# Patient Record
Sex: Female | Born: 1985 | Race: Black or African American | Hispanic: No | Marital: Single | Smoking: Never smoker
Health system: Southern US, Community
[De-identification: ages and names within clinical notes are randomized; demographics above are authoritative.]

## PROBLEM LIST (undated history)

## (undated) DIAGNOSIS — D259 Leiomyoma of uterus, unspecified: Secondary | ICD-10-CM

## (undated) DIAGNOSIS — I499 Cardiac arrhythmia, unspecified: Secondary | ICD-10-CM

## (undated) DIAGNOSIS — I10 Essential (primary) hypertension: Secondary | ICD-10-CM

## (undated) DIAGNOSIS — R002 Palpitations: Secondary | ICD-10-CM

## (undated) DIAGNOSIS — Z973 Presence of spectacles and contact lenses: Secondary | ICD-10-CM

## (undated) HISTORY — PX: WISDOM TOOTH EXTRACTION: SHX21

## (undated) HISTORY — PX: NO PAST SURGERIES: SHX2092

## (undated) HISTORY — DX: Essential (primary) hypertension: I10

## (undated) HISTORY — DX: Cardiac arrhythmia, unspecified: I49.9

---

## 2016-01-03 ENCOUNTER — Ambulatory Visit (INDEPENDENT_AMBULATORY_CARE_PROVIDER_SITE_OTHER): Payer: BLUE CROSS/BLUE SHIELD | Admitting: Physician Assistant

## 2016-01-03 VITALS — BP 154/96 | HR 105 | Temp 98.3°F | Resp 16 | Ht 61.5 in | Wt 182.0 lb

## 2016-01-03 DIAGNOSIS — IMO0001 Reserved for inherently not codable concepts without codable children: Secondary | ICD-10-CM

## 2016-01-03 DIAGNOSIS — R030 Elevated blood-pressure reading, without diagnosis of hypertension: Secondary | ICD-10-CM | POA: Diagnosis not present

## 2016-01-03 LAB — COMPLETE METABOLIC PANEL WITH GFR
ALBUMIN: 3.9 g/dL (ref 3.6–5.1)
ALK PHOS: 86 U/L (ref 33–115)
ALT: 15 U/L (ref 6–29)
AST: 13 U/L (ref 10–30)
BILIRUBIN TOTAL: 0.4 mg/dL (ref 0.2–1.2)
BUN: 12 mg/dL (ref 7–25)
CALCIUM: 9.7 mg/dL (ref 8.6–10.2)
CO2: 27 mmol/L (ref 20–31)
CREATININE: 0.63 mg/dL (ref 0.50–1.10)
Chloride: 102 mmol/L (ref 98–110)
GFR, Est African American: >89 mL/min (ref >=60)
GFR, Est Non African American: >89 mL/min (ref >=60)
Glucose, Bld: 78 mg/dL (ref 65–99)
POTASSIUM: 4.1 mmol/L (ref 3.5–5.3)
Sodium: 138 mmol/L (ref 135–146)
TOTAL PROTEIN: 7.5 g/dL (ref 6.1–8.1)

## 2016-01-03 LAB — POCT CBC
GRANULOCYTE PERCENT: 76.1 % (ref 37–80)
HCT, POC: 36.9 % — AB (ref 37.7–47.9)
Hemoglobin: 12.3 g/dL (ref 12.2–16.2)
LYMPH, POC: 1.9 (ref 0.6–3.4)
MCH, POC: 26.6 pg — AB (ref 27–31.2)
MCHC: 33.5 g/dL (ref 31.8–35.4)
MCV: 79.4 fL — AB (ref 80–97)
MID (CBC): 0.4 (ref 0–0.9)
MPV: 7 fL (ref 0–99.8)
PLATELET COUNT, POC: 285 10*3/uL (ref 142–424)
POC Granulocyte: 7.1 — AB (ref 2–6.9)
POC LYMPH %: 19.9 % (ref 10–50)
POC MID %: 4 %M (ref 0–12)
RBC: 4.64 M/uL (ref 4.04–5.48)
RDW, POC: 14 %
WBC: 9.3 10*3/uL (ref 4.6–10.2)

## 2016-01-03 LAB — TSH: TSH: 0.54 m[IU]/L

## 2016-01-03 MED ORDER — BLOOD PRESSURE KIT: 1.0000 | PACK | Status: DC

## 2016-01-03 MED ORDER — AMLODIPINE BESYLATE 5 MG PO TABS: 5.0000 mg | ORAL_TABLET | Freq: Every day | ORAL | Status: DC

## 2016-01-03 NOTE — Progress Notes (Signed)
[facility] [address] [place] [address] [phone]  Date:  01/03/2016   Name:  Lacey Carrillo   DOB:  Mar 22, 1986   MRN:  [id number]  PCP:  No primary care provider on file.    History of Present Illness:  Lacey Carrillo is a 30 y.o. female patient who presents to [facility] for elevated bp.    2 weeks ago, she had an urgent care, with elevated bp.  She went this morning for gynecologist with high bp of 190/140??.  No chest pains, sob, dizziness. She has headaches from time to time, however generally associated with not eating.  She has not eaten since 5 hours ago.  She works at a day care.  She eats breakfast and late afternoon.  She eats a lot of take out with chicken, burgers.  No vegetables.  No frozen vegetables or canned foods.  She will eat [employer] cheeseburger and fries, which she will have 1-2 times per week.  She will often get [place].   She does not exercise.  No smoking.  No children.       2 years ago at an urgent care, stated that her bp was high, but it was never navigated.    HTN of mother and father, first cousins, and uncles of both paternal and maternal sides.  No hx of MI or strokes in family.    There are no active problems to display for this patient.   Past Medical History  Diagnosis Date  . Hypertension     No past surgical history on file.  Social History  Substance Use Topics  . Smoking status: Never Smoker   . Smokeless tobacco: None  . Alcohol Use: No    Family History  Problem Relation Age of Onset  . Diabetes Mother   . Diabetes Father     No Known Allergies  Medication list has been reviewed and updated.  No current outpatient prescriptions on file prior to visit.   No current facility-administered medications on file prior to visit.    ROS   Physical Examination: BP 154/96 mmHg  Pulse 105  Temp(Src) 98.3 F (36.8 C)  Resp 16  Ht 5' 1.5" (1.562 m)  Wt 182 lb (82.555 kg)  BMI 33.84 kg/m2  LMP  12/26/2015 Ideal Body Weight: Weight in (lb) to have BMI = 25: 134.2  Physical Exam  Constitutional: She is oriented to person, place, and time. She appears well-developed and well-nourished. No distress.  HENT:  Head: Normocephalic and atraumatic.  Right Ear: External ear normal.  Left Ear: External ear normal.  Eyes: Conjunctivae and EOM are normal. Pupils are equal, round, and reactive to light.  Cardiovascular: Normal rate and regular rhythm.  Exam reveals no gallop and no friction rub.   No murmur heard. Pulses:      Carotid pulses are 2+ on the right side, and 2+ on the left side.      Radial pulses are 2+ on the right side, and 2+ on the left side.       Dorsalis pedis pulses are 2+ on the right side, and 2+ on the left side.  Pulmonary/Chest: Effort normal. No respiratory distress.  Neurological: She is alert and oriented to person, place, and time.  Skin: She is not diaphoretic.  Psychiatric: She has a normal mood and affect. Her behavior is normal.    Results for orders placed or performed in visit on 01/03/16  POCT CBC  Result  Value Ref Range   WBC 9.3 4.6 - 10.2 K/uL   Lymph, poc 1.9 0.6 - 3.4   POC LYMPH PERCENT 19.9 10 - 50 %L   MID (cbc) 0.4 0 - 0.9   POC MID % 4.0 0 - 12 %M   POC Granulocyte 7.1 (A) 2 - 6.9   Granulocyte percent 76.1 37 - 80 %G   RBC 4.64 4.04 - 5.48 M/uL   Hemoglobin 12.3 12.2 - 16.2 g/dL   HCT, POC 36.9 (A) 37.7 - 47.9 %   MCV 79.4 (A) 80 - 97 fL   MCH, POC 26.6 (A) 27 - 31.2 pg   MCHC 33.5 31.8 - 35.4 g/dL   RDW, POC 14.0 %   Platelet Count, POC 285 142 - 424 K/uL   MPV 7.0 0 - 99.8 fL   EKG: non-specific changes   Assessment and Plan: [name] is a 30 y.o. female who is here today for elevated bp. -she is started on amlodipine at this time -given dash diet and advised to start and increase exercise 4 times per week -she will check pulse and bp three times per week -rtc in 2 weeks for recheck.  Recheck ekg.  If her pulse does  not change, may have to change to bb  Elevated BP - Plan: POCT CBC, COMPLETE METABOLIC PANEL WITH GFR, TSH, EKG 12-Lead, amLODipine (NORVASC) 5 MG tablet, Blood Pressure KIT, DISCONTINUED: Blood Pressure KIT  Ivar Drape, PA-C [facility] 01/03/2016 2:13 PM

## 2016-01-03 NOTE — Patient Instructions (Addendum)
IF you received an x-ray today, you will receive an invoice from [facility]. Please contact [facility] Radiology at [phone] with questions or concerns regarding your invoice.   IF you received labwork today, you will receive an invoice from [employer]. Please contact [facility] at [phone] with questions or concerns regarding your invoice.   Our billing staff will not be able to assist you with questions regarding bills from these companies.  You will be contacted with the lab results as soon as they are available. The fastest way to get your results is to activate your My Chart account. Instructions are located on the last page of this paperwork. If you have not heard from [place] regarding the results in 2 weeks, please contact this office.  I would like you to check your blood pressure and pulse, three times per week.  Please record them on paper, or your phone would be best.   I will have you return in 2 weeks for follow up.   Included below is the dash diet.  I would like you to follow these restrictions. Please hydrate well with 64 oz of water or more per day. Avoid soft drinks and sweet teas.   Hypertension Hypertension, commonly called high blood pressure, is when the force of blood pumping through your arteries is too strong. Your arteries are the blood vessels that carry blood from your heart throughout your body. A blood pressure reading consists of a higher number over a lower number, such as 110/72. The higher number (systolic) is the pressure inside your arteries when your heart pumps. The lower number (diastolic) is the pressure inside your arteries when your heart relaxes. Ideally you want your blood pressure below 120/80. Hypertension forces your heart to work harder to pump blood. Your arteries may become narrow or stiff. Having untreated or uncontrolled hypertension can cause heart attack, stroke, kidney disease, and other problems. RISK  FACTORS Some risk factors for high blood pressure are controllable. Others are not.  Risk factors you cannot control include:   Race. You may be at higher risk if you are African American.  Age. Risk increases with age.  Gender. Men are at higher risk than women before age 55 years. After age 60, women are at higher risk than men. Risk factors you can control include:  Not getting enough exercise or physical activity.  Being overweight.  Getting too much fat, sugar, calories, or salt in your diet.  Drinking too much alcohol. SIGNS AND SYMPTOMS Hypertension does not usually cause signs or symptoms. Extremely high blood pressure (hypertensive crisis) may cause headache, anxiety, shortness of breath, and nosebleed. DIAGNOSIS To check if you have hypertension, your health care provider will measure your blood pressure while you are seated, with your arm held at the level of your heart. It should be measured at least twice using the same arm. Certain conditions can cause a difference in blood pressure between your right and left arms. A blood pressure reading that is higher than normal on one occasion does not mean that you need treatment. If it is not clear whether you have high blood pressure, you may be asked to return on a different day to have your blood pressure checked again. Or, you may be asked to monitor your blood pressure at home for 1 or more weeks. TREATMENT Treating high blood pressure includes making lifestyle changes and possibly taking medicine. Living a healthy lifestyle can help lower high blood pressure. You may need to  change some of your habits. Lifestyle changes may include:  Following the DASH diet. This diet is high in fruits, vegetables, and whole grains. It is low in salt, red meat, and added sugars.  Keep your sodium intake below 2,300 mg per day.  Getting at least 30-45 minutes of aerobic exercise at least 4 times per week.  Losing weight if necessary.  Not  smoking.  Limiting alcoholic beverages.  Learning ways to reduce stress. Your health care provider may prescribe medicine if lifestyle changes are not enough to get your blood pressure under control, and if one of the following is true:  You are 35-72 years of age and your systolic blood pressure is above 140.  You are 51 years of age or older, and your systolic blood pressure is above 150.  Your diastolic blood pressure is above 90.  You have diabetes, and your systolic blood pressure is over [id number] or your diastolic blood pressure is over 90.  You have kidney disease and your blood pressure is above 140/90.  You have heart disease and your blood pressure is above 140/90. Your personal target blood pressure may vary depending on your medical conditions, your age, and other factors. HOME CARE INSTRUCTIONS  Have your blood pressure rechecked as directed by your health care provider.   Take medicines only as directed by your health care provider. Follow the directions carefully. Blood pressure medicines must be taken as prescribed. The medicine does not work as well when you skip doses. Skipping doses also puts you at risk for problems.  Do not smoke.   Monitor your blood pressure at home as directed by your health care provider. SEEK MEDICAL CARE IF:   You think you are having a reaction to medicines taken.  You have recurrent headaches or feel dizzy.  You have swelling in your ankles.  You have trouble with your vision. SEEK IMMEDIATE MEDICAL CARE IF:  You develop a severe headache or confusion.  You have unusual weakness, numbness, or feel faint.  You have severe chest or abdominal pain.  You vomit repeatedly.  You have trouble breathing. MAKE SURE YOU:   Understand these instructions.  Will watch your condition.  Will get help right away if you are not doing well or get worse.   This information is not intended to replace advice given to you by your health  care provider. Make sure you discuss any questions you have with your health care provider.   Document Released: 10/09/2005 Document Revised: 02/23/2015 Document Reviewed: 08/01/2013 Elsevier Interactive Patient Education 2016 [place] DASH stands for "Dietary Approaches to Stop Hypertension." The DASH eating plan is a healthy eating plan that has been shown to reduce high blood pressure (hypertension). Additional health benefits may include reducing the risk of type 2 diabetes mellitus, heart disease, and stroke. The DASH eating plan may also help with weight loss. WHAT DO I NEED TO KNOW ABOUT THE DASH EATING PLAN? For the DASH eating plan, you will follow these general guidelines:  Choose foods with a percent daily value for sodium of less than 5% (as listed on the food label).  Use salt-free seasonings or herbs instead of table salt or sea salt.  Check with your health care provider or pharmacist before using salt substitutes.  Eat lower-sodium products, often labeled as "lower sodium" or "no salt added."  Eat fresh foods.  Eat more vegetables, fruits, and low-fat dairy products.  Choose whole grains. Look for the  word "whole" as the first word in the ingredient list.  Choose fish and skinless chicken or [place] more often than red meat. Limit fish, poultry, and meat to 6 oz (170 g) each day.  Limit sweets, desserts, sugars, and sugary drinks.  Choose heart-healthy fats.  Limit cheese to 1 oz (28 g) per day.  Eat more home-cooked food and less restaurant, buffet, and fast food.  Limit fried foods.  Cook foods using methods other than frying.  Limit canned vegetables. If you do use them, rinse them well to decrease the sodium.  When eating at a restaurant, ask that your food be prepared with less salt, or no salt if possible. WHAT FOODS CAN I EAT? Seek help from a dietitian for individual calorie needs. Grains Whole grain or whole wheat bread.  Brown rice. Whole grain or whole wheat pasta. Quinoa, bulgur, and whole grain cereals. Low-sodium cereals. Corn or whole wheat flour tortillas. Whole grain cornbread. Whole grain crackers. Low-sodium crackers. Vegetables Fresh or frozen vegetables (raw, steamed, roasted, or grilled). Low-sodium or reduced-sodium tomato and vegetable juices. Low-sodium or reduced-sodium tomato sauce and paste. Low-sodium or reduced-sodium canned vegetables.  Fruits All fresh, canned (in natural juice), or frozen fruits. Meat and Other Protein Products Ground beef (85% or leaner), grass-fed beef, or beef trimmed of fat. Skinless chicken or [place]. Ground chicken or [place]. Pork trimmed of fat. All fish and seafood. Eggs. Dried beans, peas, or lentils. Unsalted nuts and seeds. Unsalted canned beans. Dairy Low-fat dairy products, such as skim or 1% milk, 2% or reduced-fat cheeses, low-fat ricotta or cottage cheese, or plain low-fat yogurt. Low-sodium or reduced-sodium cheeses. Fats and Oils Tub margarines without trans fats. Light or reduced-fat mayonnaise and salad dressings (reduced sodium). Avocado. Safflower, olive, or canola oils. Natural peanut or almond butter. Other Unsalted popcorn and pretzels. The items listed above may not be a complete list of recommended foods or beverages. Contact your dietitian for more options. WHAT FOODS ARE NOT RECOMMENDED? Grains White bread. White pasta. White rice. Refined cornbread. Bagels and croissants. Crackers that contain trans fat. Vegetables Creamed or fried vegetables. Vegetables in a cheese sauce. Regular canned vegetables. Regular canned tomato sauce and paste. Regular tomato and vegetable juices. Fruits Dried fruits. Canned fruit in light or heavy syrup. Fruit juice. Meat and Other Protein Products Fatty cuts of meat. Ribs, chicken wings, bacon, sausage, bologna, salami, chitterlings, fatback, hot dogs, bratwurst, and packaged luncheon meats. Salted nuts and seeds.  Canned beans with salt. Dairy Whole or 2% milk, cream, half-and-half, and cream cheese. Whole-fat or sweetened yogurt. Full-fat cheeses or blue cheese. Nondairy creamers and whipped toppings. Processed cheese, cheese spreads, or cheese curds. Condiments Onion and garlic salt, seasoned salt, table salt, and sea salt. Canned and packaged gravies. Worcestershire sauce. Tartar sauce. Barbecue sauce. Teriyaki sauce. Soy sauce, including reduced sodium. Steak sauce. Fish sauce. Oyster sauce. Cocktail sauce. Horseradish. Ketchup and mustard. Meat flavorings and tenderizers. Bouillon cubes. Hot sauce. Tabasco sauce. Marinades. Taco seasonings. Relishes. Fats and Oils Butter, stick margarine, lard, shortening, ghee, and bacon fat. Coconut, palm kernel, or palm oils. Regular salad dressings. Other Pickles and olives. Salted popcorn and pretzels. The items listed above may not be a complete list of foods and beverages to avoid. Contact your dietitian for more information. WHERE CAN I FIND MORE INFORMATION? National Heart, Lung, and Blood Institute: travelstabloid.com   This information is not intended to replace advice given to you by your health care provider. Make sure you discuss  any questions you have with your health care provider.   Document Released: 09/28/2011 Document Revised: 10/30/2014 Document Reviewed: 08/13/2013 Elsevier Interactive Patient Education [employer].

## 2016-01-18 ENCOUNTER — Ambulatory Visit (INDEPENDENT_AMBULATORY_CARE_PROVIDER_SITE_OTHER): Payer: BLUE CROSS/BLUE SHIELD | Admitting: Physician Assistant

## 2016-01-18 VITALS — BP 142/90 | HR 103 | Temp 98.5°F | Resp 19 | Wt 180.0 lb

## 2016-01-18 DIAGNOSIS — I10 Essential (primary) hypertension: Secondary | ICD-10-CM | POA: Diagnosis not present

## 2016-01-18 MED ORDER — METOPROLOL SUCCINATE ER 25 MG PO TB24: 25.0000 mg | ORAL_TABLET | Freq: Every day | ORAL | Status: DC

## 2016-01-18 NOTE — Patient Instructions (Addendum)
IF you received an x-ray today, you will receive an invoice from [facility] Radiology. Please contact [facility]iology at [phone] with questions or concerns regarding your invoice.   IF you received labwork today, you will receive an invoice from [employer]. Please contact [facility] at [phone] with questions or concerns regarding your invoice.   Our billing staff will not be able to assist you with questions regarding bills from these companies.  You will be contacted with the lab results as soon as they are available. The fastest way to get your results is to activate your My Chart account. Instructions are located on the last page of this paperwork. If you have not heard from [place] regarding the results in 2 weeks, please contact this office.    I would like to follow up in 2-3 weeks for recheck. You can start exercising at this time.  I will include the dash diet for your review.  DASH Eating Plan DASH stands for "Dietary Approaches to Stop Hypertension." The DASH eating plan is a healthy eating plan that has been shown to reduce high blood pressure (hypertension). Additional health benefits may include reducing the risk of type 2 diabetes mellitus, heart disease, and stroke. The DASH eating plan may also help with weight loss. WHAT DO I NEED TO KNOW ABOUT THE DASH EATING PLAN? For the DASH eating plan, you will follow these general guidelines:  Choose foods with a percent daily value for sodium of less than 5% (as listed on the food label).  Use salt-free seasonings or herbs instead of table salt or sea salt.  Check with your health care provider or pharmacist before using salt substitutes.  Eat lower-sodium products, often labeled as "lower sodium" or "no salt added."  Eat fresh foods.  Eat more vegetables, fruits, and low-fat dairy products.  Choose whole grains. Look for the word "whole" as the first word in the ingredient  list.  Choose fish and skinless chicken or [place] more often than red meat. Limit fish, poultry, and meat to 6 oz (170 g) each day.  Limit sweets, desserts, sugars, and sugary drinks.  Choose heart-healthy fats.  Limit cheese to 1 oz (28 g) per day.  Eat more home-cooked food and less restaurant, buffet, and fast food.  Limit fried foods.  Cook foods using methods other than frying.  Limit canned vegetables. If you do use them, rinse them well to decrease the sodium.  When eating at a restaurant, ask that your food be prepared with less salt, or no salt if possible. WHAT FOODS CAN I EAT? Seek help from a dietitian for individual calorie needs. Grains Whole grain or whole wheat bread. Brown rice. Whole grain or whole wheat pasta. Quinoa, bulgur, and whole grain cereals. Low-sodium cereals. Corn or whole wheat flour tortillas. Whole grain cornbread. Whole grain crackers. Low-sodium crackers. Vegetables Fresh or frozen vegetables (raw, steamed, roasted, or grilled). Low-sodium or reduced-sodium tomato and vegetable juices. Low-sodium or reduced-sodium tomato sauce and paste. Low-sodium or reduced-sodium canned vegetables.  Fruits All fresh, canned (in natural juice), or frozen fruits. Meat and Other Protein Products Ground beef (85% or leaner), grass-fed beef, or beef trimmed of fat. Skinless chicken or [place]. Ground chicken or [place]. Pork trimmed of fat. All fish and seafood. Eggs. Dried beans, peas, or lentils. Unsalted nuts and seeds. Unsalted canned beans. Dairy Low-fat dairy products, such as skim or 1% milk, 2% or reduced-fat cheeses, low-fat ricotta or cottage cheese, or plain  low-fat yogurt. Low-sodium or reduced-sodium cheeses. Fats and Oils Tub margarines without trans fats. Light or reduced-fat mayonnaise and salad dressings (reduced sodium). Avocado. Safflower, olive, or canola oils. Natural peanut or almond butter. Other Unsalted popcorn and pretzels. The items listed  above may not be a complete list of recommended foods or beverages. Contact your dietitian for more options. WHAT FOODS ARE NOT RECOMMENDED? Grains White bread. White pasta. White rice. Refined cornbread. Bagels and croissants. Crackers that contain trans fat. Vegetables Creamed or fried vegetables. Vegetables in a cheese sauce. Regular canned vegetables. Regular canned tomato sauce and paste. Regular tomato and vegetable juices. Fruits Dried fruits. Canned fruit in light or heavy syrup. Fruit juice. Meat and Other Protein Products Fatty cuts of meat. Ribs, chicken wings, bacon, sausage, bologna, salami, chitterlings, fatback, hot dogs, bratwurst, and packaged luncheon meats. Salted nuts and seeds. Canned beans with salt. Dairy Whole or 2% milk, cream, half-and-half, and cream cheese. Whole-fat or sweetened yogurt. Full-fat cheeses or blue cheese. Nondairy creamers and whipped toppings. Processed cheese, cheese spreads, or cheese curds. Condiments Onion and garlic salt, seasoned salt, table salt, and sea salt. Canned and packaged gravies. Worcestershire sauce. Tartar sauce. Barbecue sauce. Teriyaki sauce. Soy sauce, including reduced sodium. Steak sauce. Fish sauce. Oyster sauce. Cocktail sauce. Horseradish. Ketchup and mustard. Meat flavorings and tenderizers. Bouillon cubes. Hot sauce. Tabasco sauce. Marinades. Taco seasonings. Relishes. Fats and Oils Butter, stick margarine, lard, shortening, ghee, and bacon fat. Coconut, palm kernel, or palm oils. Regular salad dressings. Other Pickles and olives. Salted popcorn and pretzels. The items listed above may not be a complete list of foods and beverages to avoid. Contact your dietitian for more information. WHERE CAN I FIND MORE INFORMATION? National Heart, Lung, and Blood Institute: travelstabloid.com   This information is not intended to replace advice given to you by your health care provider. Make sure you  discuss any questions you have with your health care provider.   Document Released: 09/28/2011 Document Revised: 10/30/2014 Document Reviewed: 08/13/2013 Elsevier Interactive Patient Education [employer].

## 2016-01-31 ENCOUNTER — Other Ambulatory Visit: Payer: Self-pay | Admitting: Physician Assistant

## 2016-01-31 NOTE — Telephone Encounter (Signed)
Pt is requesting a refill of amLODipine (NORVASC) 5 MG tablet. [facility] on [address] Please call when sent to pharmacy.

## 2016-02-20 NOTE — Progress Notes (Signed)
[facility] [address] [place] [address] [phone]  Date:  01/18/2016   Name:  Lacey Carrillo   DOB:  1986-06-04   MRN:  [id number]  PCP:  No primary care provider on file.   Chief Complaint  Patient presents with  . Follow-up    BP   History of Present Illness:  Lacey Carrillo is a 30 y.o. female patient who presents to [facility] for blood pressure recheck.  --Patient states that her blood pressure continues to be elevated.  She denies chest pains, palpitations, leg swelling, or dizziness.  She has attempted to avoid some her sodium intake, however not much.  She reports no adverse effect with the medication at this time.  She has not started to exercise, however has plans to begin.      There are no active problems to display for this patient.   Past Medical History  Diagnosis Date  . Hypertension     No past surgical history on file.  Social History  Substance Use Topics  . Smoking status: Never Smoker   . Smokeless tobacco: None  . Alcohol Use: No    Family History  Problem Relation Age of Onset  . Diabetes Mother   . Diabetes Father     No Known Allergies  Medication list has been reviewed and updated.  Current Outpatient Prescriptions on File Prior to Visit  Medication Sig Dispense Refill  . Blood Pressure KIT 1 each by Does not apply route 3 (three) times a week. 1 each 0   No current facility-administered medications on file prior to visit.    ROS ROS otherwise unremarkable unless listed above.   Physical Examination: BP 142/90 mmHg  Pulse 103  Temp(Src) 98.5 F (36.9 C) (Oral)  Resp 19  Wt 180 lb (81.647 kg)  SpO2 98%  LMP 12/26/2015 Ideal Body Weight:    Physical Exam  Constitutional: She is oriented to person, place, and time. She appears well-developed and well-nourished. No distress.  HENT:  Head: Normocephalic and atraumatic.  Right Ear: External ear normal.  Left Ear: External ear normal.  Eyes:  Conjunctivae and EOM are normal. Pupils are equal, round, and reactive to light.  Cardiovascular: Regular rhythm and normal heart sounds.  Exam reveals no gallop and no friction rub.   No murmur heard. Pulses:      Carotid pulses are 2+ on the right side.      Radial pulses are 2+ on the right side, and 2+ on the left side.       Dorsalis pedis pulses are 2+ on the right side, and 2+ on the left side.  Minimal tach  Pulmonary/Chest: Effort normal. No respiratory distress. She has no wheezes.  Neurological: She is alert and oriented to person, place, and time.  Skin: She is not diaphoretic.  Psychiatric: She has a normal mood and affect. Her behavior is normal.     Assessment and Plan: Lacey Carrillo is a 30 y.o. female who is here today for blood pressure recheck. --with bp continuing to be elevated, as well as heart rate (we will start her on a long acting beta blocker.  I have advised that she start exercise regimen, and adhere to DASH diet.  She will return in 2-3 weeks for recheck.  Essential hypertension - Plan: metoprolol succinate (TOPROL-XL) 25 MG 24 hr tablet  Ivar Drape, PA-C [facility]

## 2018-08-22 ENCOUNTER — Other Ambulatory Visit: Payer: Self-pay

## 2018-08-22 ENCOUNTER — Encounter (HOSPITAL_COMMUNITY): Payer: Self-pay | Admitting: Emergency Medicine

## 2018-08-22 ENCOUNTER — Emergency Department (HOSPITAL_COMMUNITY)
Admission: EM | Admit: 2018-08-22 | Discharge: 2018-08-23 | Disposition: A | Discharge status: Home / Self Care | Payer: BLUE CROSS/BLUE SHIELD | Attending: Emergency Medicine | Admitting: Emergency Medicine

## 2018-08-22 DIAGNOSIS — Z79899 Other long term (current) drug therapy: Secondary | ICD-10-CM | POA: Insufficient documentation

## 2018-08-22 DIAGNOSIS — I10 Essential (primary) hypertension: Secondary | ICD-10-CM | POA: Insufficient documentation

## 2018-08-22 DIAGNOSIS — Y929 Unspecified place or not applicable: Secondary | ICD-10-CM | POA: Insufficient documentation

## 2018-08-22 DIAGNOSIS — S93401A Sprain of unspecified ligament of right ankle, initial encounter: Secondary | ICD-10-CM | POA: Insufficient documentation

## 2018-08-22 DIAGNOSIS — X58XXXA Exposure to other specified factors, initial encounter: Secondary | ICD-10-CM | POA: Insufficient documentation

## 2018-08-22 DIAGNOSIS — Y999 Unspecified external cause status: Secondary | ICD-10-CM | POA: Insufficient documentation

## 2018-08-22 DIAGNOSIS — Y939 Activity, unspecified: Secondary | ICD-10-CM | POA: Diagnosis not present

## 2018-08-22 NOTE — ED Triage Notes (Signed)
Pt had a mechanical fall down two steps.  Her right ankle is swollen and painful. She is able to put weight on it but there is pain.

## 2018-08-23 ENCOUNTER — Emergency Department (HOSPITAL_COMMUNITY): Payer: BLUE CROSS/BLUE SHIELD

## 2018-08-23 MED ORDER — IBUPROFEN 800 MG PO TABS
800.0000 mg | ORAL_TABLET | Freq: Once | ORAL | Status: AC
  Administered 2018-08-23: 800 mg via ORAL
  Filled 2018-08-23: qty 1

## 2018-08-23 NOTE — Discharge Instructions (Signed)
We recommend consistent use of ibuprofen 600mg  every 6 hours. Wear a brace for stability. Ice We recommend follow-up with a primary care doctor to ensure resolution of symptoms.  Return to the ED for any new or concerning symptoms.

## 2018-08-23 NOTE — ED Provider Notes (Signed)
[facility] EMERGENCY DEPARTMENT Provider Note   CSN: [id number] Arrival date & time: 08/22/18  2322    History   Chief Complaint Chief Complaint  Patient presents with  . Ankle Pain    HPI Lacey Carrillo is a 32 y.o. female.  The history is provided by the patient. No language interpreter was used.  Ankle Pain   The incident occurred 12 to 24 hours ago. The incident occurred at home. Injury mechanism: twisted while walking down steps. The pain is present in the right ankle. The quality of the pain is described as aching. The pain is mild. The pain has been constant since onset. Pertinent negatives include no inability to bear weight, no loss of motion and no tingling. She reports no foreign bodies present. The symptoms are aggravated by bearing weight. She has tried ice for the symptoms. The treatment provided no relief.    Past Medical History:  Diagnosis Date  . Hypertension     There are no active problems to display for this patient.   History reviewed. No pertinent surgical history.   OB History   None      Home Medications    Prior to Admission medications   Medication Sig Start Date End Date Taking? Authorizing Provider  amLODipine (NORVASC) 5 MG tablet TAKE 1 TABLET BY MOUTH DAILY 02/02/16   Ivar Drape D, PA  Blood Pressure KIT 1 each by Does not apply route 3 (three) times a week. 01/03/16   Ivar Drape D, PA  metoprolol succinate (TOPROL-XL) 25 MG 24 hr tablet Take 1 tablet (25 mg total) by mouth daily. 01/18/16   Joretta Bachelor, PA    Family History Family History  Problem Relation Age of Onset  . Diabetes Mother   . Diabetes Father     Social History Social History   Tobacco Use  . Smoking status: Never Smoker  Substance Use Topics  . Alcohol use: No    Alcohol/week: 0.0 standard drinks  . Drug use: No     Allergies   Patient has no known allergies.   Review of Systems Review of Systems    Musculoskeletal: Positive for arthralgias.  Neurological: Negative for tingling.  Ten systems reviewed and are negative for acute change, except as noted in the HPI.    Physical Exam Updated Vital Signs BP (!) 128/97 (BP Location: Right Arm)   Pulse (!) 104   Temp 99.2 F (37.3 C) (Oral)   Resp 14   Ht 5' (1.524 m)   Wt 85.7 kg   SpO2 100%   BMI 36.91 kg/m   Physical Exam  Constitutional: She is oriented to person, place, and time. She appears well-developed and well-nourished. No distress.  Nontoxic appearing and in NAD  HENT:  Head: Normocephalic and atraumatic.  Eyes: Conjunctivae and EOM are normal. No scleral icterus.  Neck: Normal range of motion.  Cardiovascular: Normal rate, regular rhythm and intact distal pulses.  DP pulse 2+ in the RLE  Pulmonary/Chest: Effort normal. No stridor. No respiratory distress.  Respirations even and unlabored  Musculoskeletal: Normal range of motion.  Mild swelling to the lateral malleolus. No deformity. Normal ROM of the R ankle.  Neurological: She is alert and oriented to person, place, and time. She exhibits normal muscle tone. Coordination normal.  Sensation to light touch intact. Able to wiggle all toes.  Skin: Skin is warm and dry. No rash noted. She is not diaphoretic. No erythema. No pallor.  Psychiatric:  She has a normal mood and affect. Her behavior is normal.  Nursing note and vitals reviewed.    ED Treatments / Results  Labs (all labs ordered are listed, but only abnormal results are displayed) Labs Reviewed - No data to display  EKG None  Radiology Dg Ankle Complete Right  Result Date: 08/23/2018 CLINICAL DATA:  Fall tonight with right ankle pain. EXAM: RIGHT ANKLE - COMPLETE 3+ VIEW COMPARISON:  None. FINDINGS: There is no evidence of fracture, dislocation, or joint effusion. There is no evidence of arthropathy or other focal bone abnormality. Soft tissues are unremarkable. IMPRESSION: Negative. Electronically  Signed   By: Marin Olp M.D.   On: 08/23/2018 01:48    Procedures Procedures (including critical care time)  Medications Ordered in ED Medications  ibuprofen (ADVIL,MOTRIN) tablet 800 mg (800 mg Oral Given 08/23/18 0145)     Initial Impression / Assessment and Plan / ED Course  I have reviewed the triage vital signs and the nursing notes.  Pertinent labs & imaging results that were available during my care of the patient were reviewed by me and considered in my medical decision making (see chart for details).     Patient presents to the emergency department for evaluation of R ankle pain. Patient neurovascularly intact on exam. Imaging negative for fracture, dislocation, bony deformity. No swelling, erythema, heat to touch to the affected area; no concern for septic joint. Compartments in the affected extremity are soft. Plan for supportive management including RICE and NSAIDs; primary care follow up as needed. Return precautions discussed and provided. Patient discharged in stable condition with no unaddressed concerns.   Final Clinical Impressions(s) / ED Diagnoses   Final diagnoses:  Sprain of right ankle, unspecified ligament, initial encounter    ED Discharge Orders    None       Antonietta Breach, PA-C 08/23/18 [id number]    Orpah Greek, MD 08/23/18 [phone]

## 2018-08-23 NOTE — ED Notes (Signed)
Pt verbalizes understanding of d/c instructions. Pt taken to lobby in wheelchair at d/c with all belongings.   

## 2018-10-23 DIAGNOSIS — Z8616 Personal history of COVID-19: Secondary | ICD-10-CM

## 2018-10-23 HISTORY — DX: Personal history of COVID-19: Z86.16

## 2019-08-12 DIAGNOSIS — I10 Essential (primary) hypertension: Secondary | ICD-10-CM | POA: Insufficient documentation

## 2019-12-26 IMAGING — DX DG ANKLE COMPLETE 3+V*R*
3 series · 3 of 3 positions shown · non-contrast
Comparison: None.

CLINICAL DATA: Fall tonight with right ankle pain.

EXAM:
RIGHT ANKLE - COMPLETE 3+ VIEW

[ankle ap]
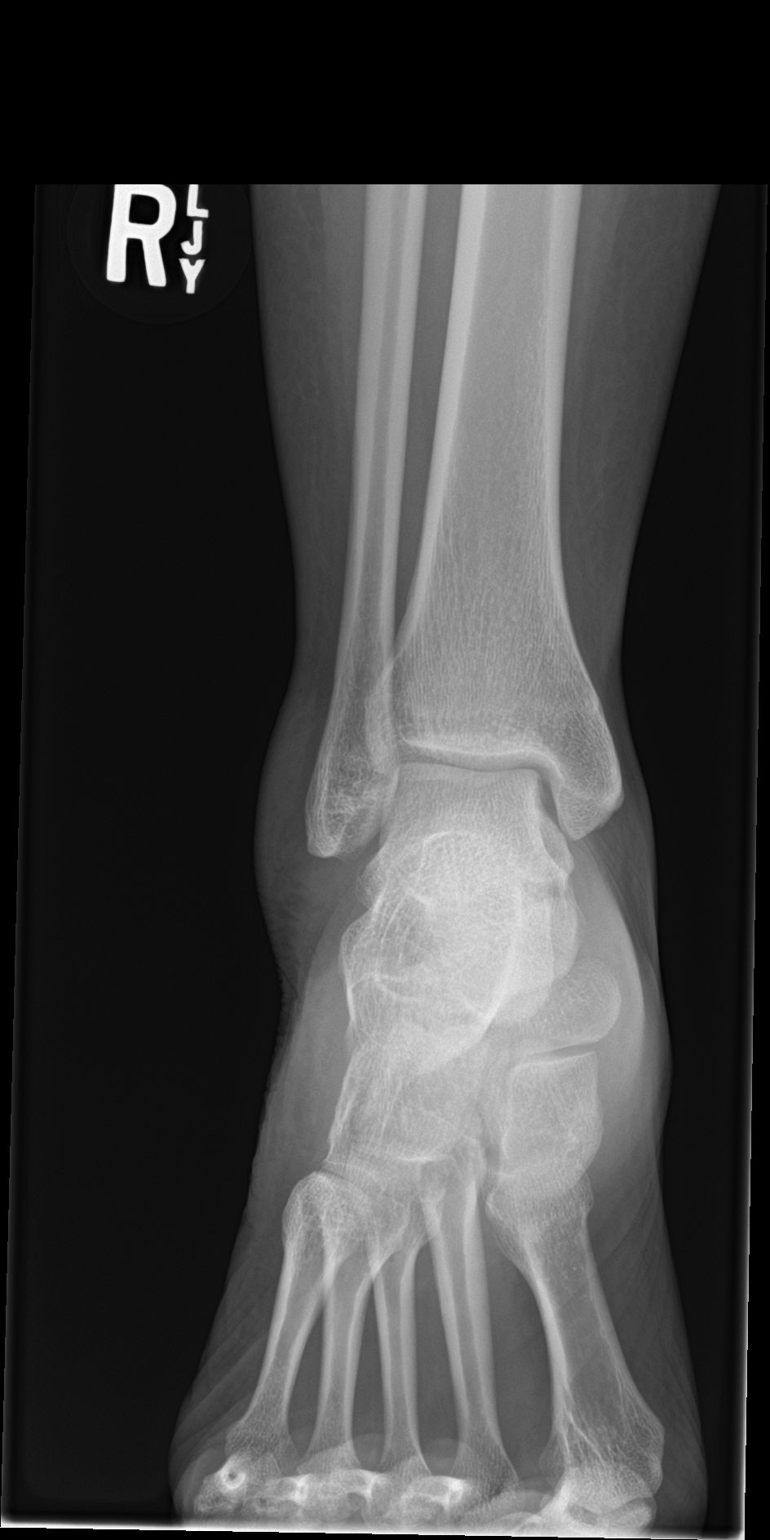

[ankle obl]
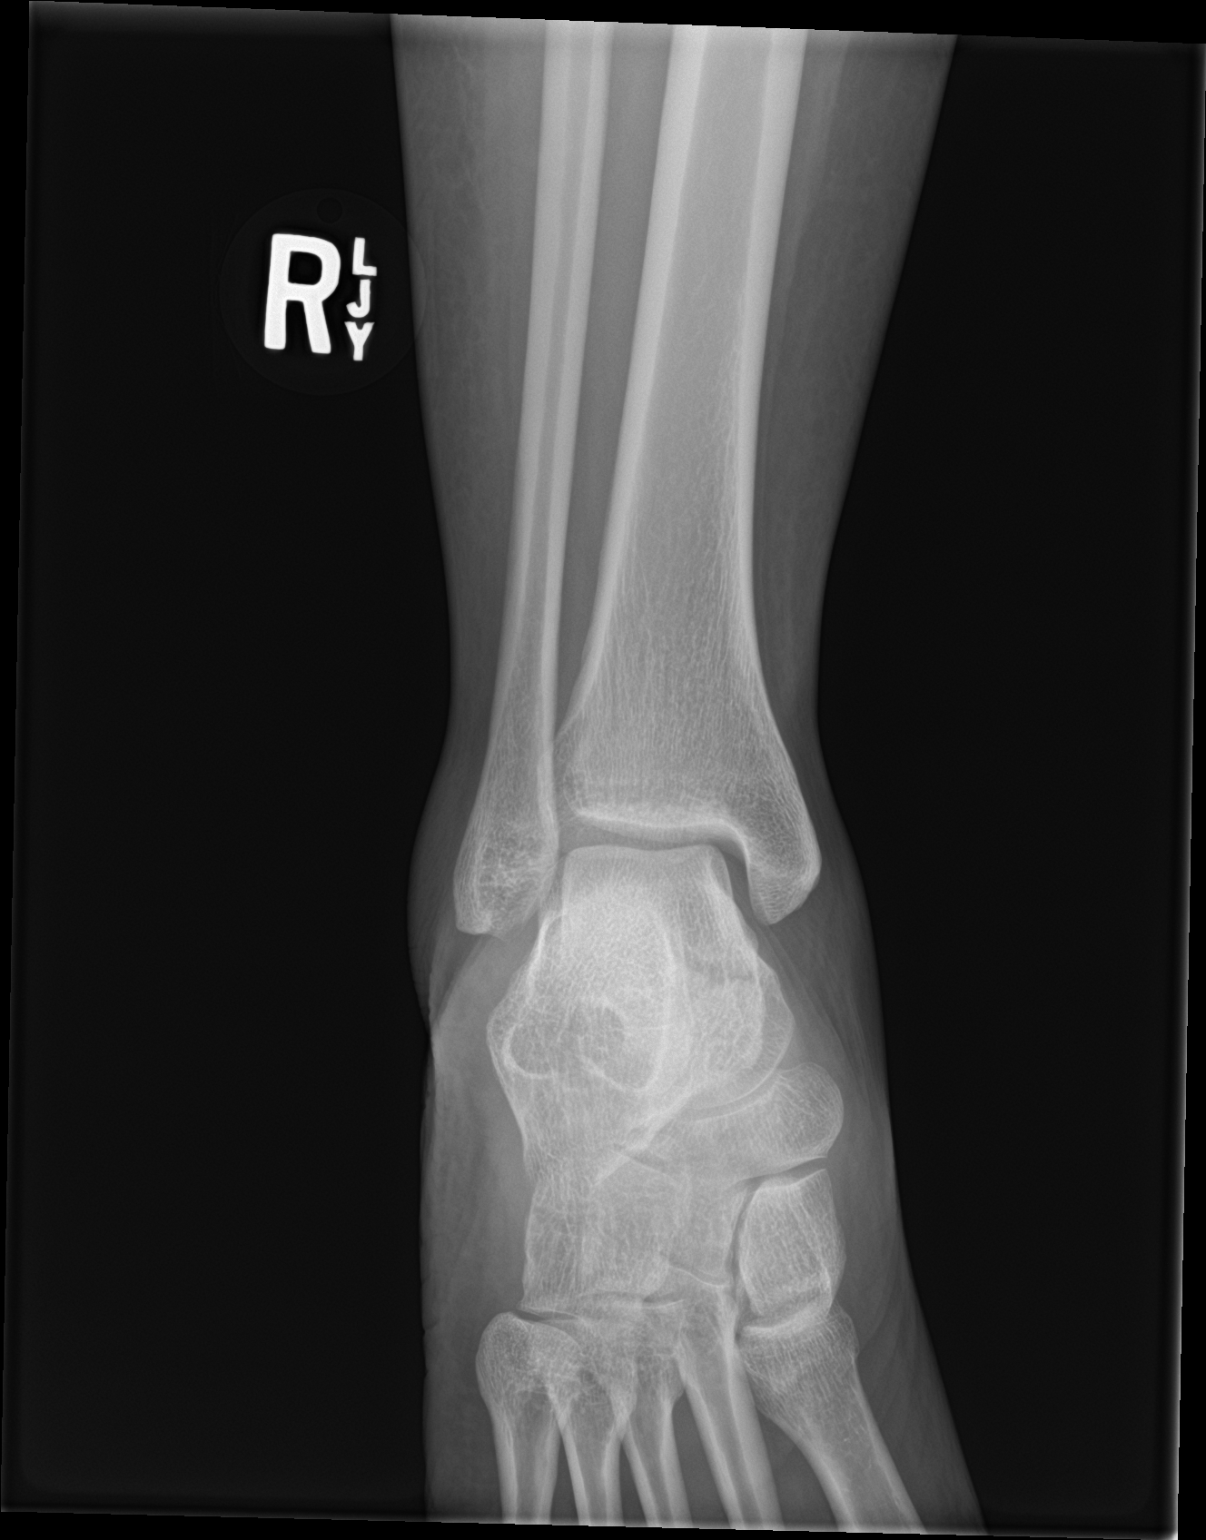

[ankle lat]
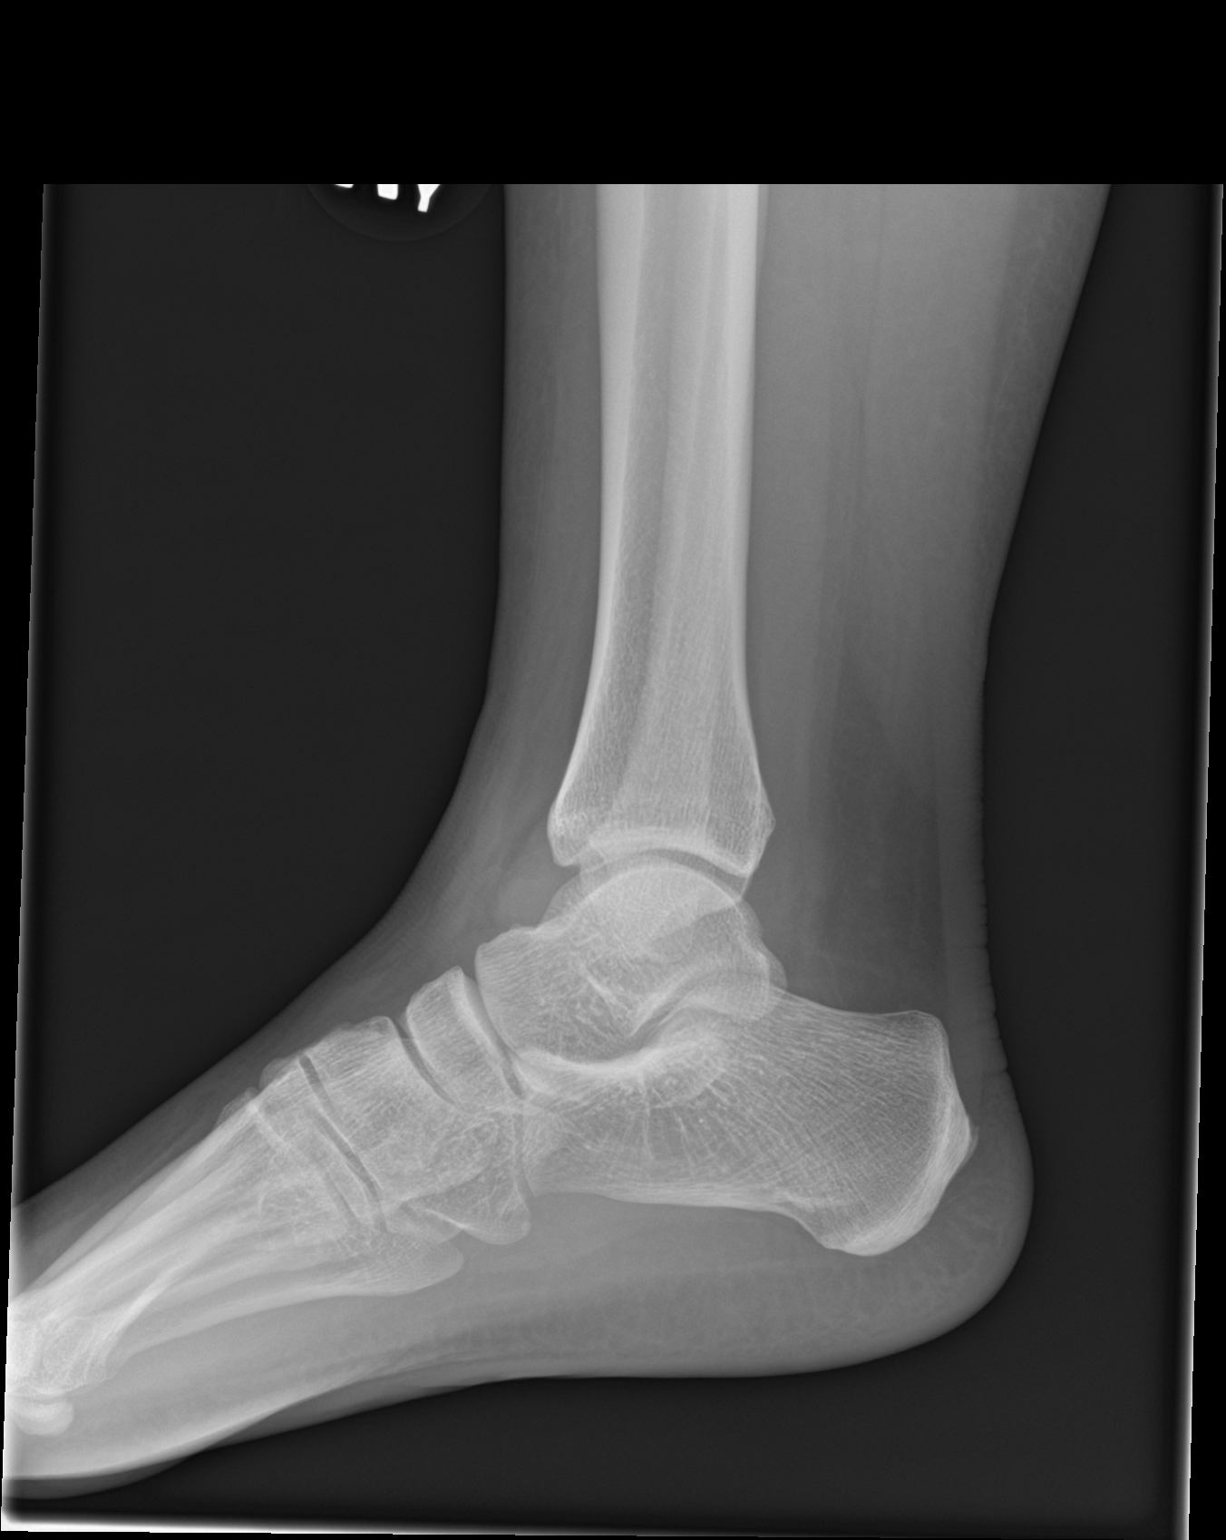

[3 of 3 positions shown; findings below may reference images not displayed]

FINDINGS: There is no evidence of fracture, dislocation, or joint effusion.
There is no evidence of arthropathy or other focal bone abnormality.
Soft tissues are unremarkable.
IMPRESSION: Negative.

## 2020-02-11 DIAGNOSIS — D259 Leiomyoma of uterus, unspecified: Secondary | ICD-10-CM | POA: Insufficient documentation

## 2020-07-08 ENCOUNTER — Ambulatory Visit (INDEPENDENT_AMBULATORY_CARE_PROVIDER_SITE_OTHER): Payer: 59 | Admitting: Nurse Practitioner

## 2020-07-08 ENCOUNTER — Encounter: Payer: Self-pay | Admitting: Nurse Practitioner

## 2020-07-08 ENCOUNTER — Other Ambulatory Visit: Payer: Self-pay

## 2020-07-08 VITALS — BP 133/90 | HR 80 | Temp 98.5°F | Ht 60.0 in | Wt 180.0 lb

## 2020-07-08 DIAGNOSIS — R0981 Nasal congestion: Secondary | ICD-10-CM

## 2020-07-08 DIAGNOSIS — I10 Essential (primary) hypertension: Secondary | ICD-10-CM | POA: Diagnosis not present

## 2020-07-08 DIAGNOSIS — E559 Vitamin D deficiency, unspecified: Secondary | ICD-10-CM | POA: Diagnosis not present

## 2020-07-08 DIAGNOSIS — T3695XA Adverse effect of unspecified systemic antibiotic, initial encounter: Secondary | ICD-10-CM

## 2020-07-08 DIAGNOSIS — B379 Candidiasis, unspecified: Secondary | ICD-10-CM

## 2020-07-08 MED ORDER — FLUCONAZOLE 150 MG PO TABS: 150.0000 mg | ORAL_TABLET | Freq: Once | ORAL | 0 refills | Status: AC

## 2020-07-08 MED ORDER — AMOXICILLIN-POT CLAVULANATE 875-125 MG PO TABS: 1.0000 | ORAL_TABLET | Freq: Two times a day (BID) | ORAL | 0 refills | Status: AC

## 2020-07-08 MED ORDER — METOPROLOL SUCCINATE ER 100 MG PO TB24: 100.0000 mg | ORAL_TABLET | Freq: Every day | ORAL | 3 refills | Status: DC

## 2020-07-08 MED ORDER — AMLODIPINE BESYLATE 5 MG PO TABS: 5.0000 mg | ORAL_TABLET | Freq: Every day | ORAL | 3 refills | Status: DC

## 2020-07-08 NOTE — Progress Notes (Signed)
[place], [place]  [address] Phone:  [phone]   Fax:  [phone]   New Patient Office Visit  Subjective:  Patient ID: Lacey Carrillo, female    DOB: 1986-06-09  Age: 34 y.o. MRN: [id number]  CC:  Chief Complaint  Patient presents with  . Establish Care    experiencing no problems, wants to continue meds that is currently on    HPI Lacey Carrillo presents to establish care. She  has a past medical history of Hypertension.   Sinus Pain Patient complains of nasal congestion, post nasal drip and ear pain. Onset of symptoms was a few days ago. Symptoms have been gradually worsening since that time. She is drinking plenty of fluids.  Past history is significant for recrrent ear infections. Patient is non-smoker.   Past Medical History:  Diagnosis Date  . Hypertension     History reviewed. No pertinent surgical history.  Family History  Problem Relation Age of Onset  . Diabetes Mother   . Diabetes Father     Social History   Socioeconomic History  . Marital status: Single    Spouse name: Not on file  . Number of children: Not on file  . Years of education: Not on file  . Highest education level: Not on file  Occupational History  . Not on file  Tobacco Use  . Smoking status: Never Smoker  . Smokeless tobacco: Never Used  Substance and Sexual Activity  . Alcohol use: No    Alcohol/week: 0.0 standard drinks  . Drug use: No  . Sexual activity: Not Currently  Other Topics Concern  . Not on file  Social History Narrative  . Not on file   Social Determinants of Health   Financial Resource Strain:   . Difficulty of Paying Living Expenses: Not on file  Food Insecurity:   . Worried About Charity fundraiser in the Last Year: Not on file  . Ran Out of Food in the Last Year: Not on file  Transportation Needs:   . Lack of Transportation (Medical): Not on file  . Lack of Transportation (Non-Medical): Not on file    Physical Activity:   . Days of Exercise per Week: Not on file  . Minutes of Exercise per Session: Not on file  Stress:   . Feeling of Stress : Not on file  Social Connections:   . Frequency of Communication with Friends and Family: Not on file  . Frequency of Social Gatherings with Friends and Family: Not on file  . Attends Religious Services: Not on file  . Active Member of Clubs or Organizations: Not on file  . Attends Archivist Meetings: Not on file  . Marital Status: Not on file  Intimate Partner Violence:   . Fear of Current or Ex-Partner: Not on file  . Emotionally Abused: Not on file  . Physically Abused: Not on file  . Sexually Abused: Not on file    ROS Review of Systems  Constitutional: Positive for chills.  HENT:       On an off not consistent Hx of ear infections     Objective:   Today's Vitals: BP 133/90   Pulse 80   Temp 98.5 F (36.9 C) (Temporal)   Ht 5' (1.524 m)   Wt 180 lb (81.6 kg)   SpO2 100%   BMI 35.15 kg/m   Physical Exam Constitutional:      General: She is  in acute distress.  HENT:     Head: Normocephalic and atraumatic.     Nose: Nose normal.     Mouth/Throat:     Mouth: Mucous membranes are moist.  Cardiovascular:     Rate and Rhythm: Normal rate and regular rhythm.     Pulses: Normal pulses.     Heart sounds: Normal heart sounds.  Musculoskeletal:     Cervical back: Normal range of motion.  Neurological:     Mental Status: She is alert.     Assessment & Plan:   Problem List Items Addressed This Visit    None    Visit Diagnoses    Sinus congestion    -  Primary   Relevant Medications   amoxicillin-clavulanate (AUGMENTIN) 875-125 MG tablet   Essential hypertension       Relevant Medications   aspirin EC 81 MG tablet   amLODipine (NORVASC) 5 MG tablet   metoprolol succinate (TOPROL-XL) 100 MG 24 hr tablet   Vitamin D deficiency       Relevant Medications   ergocalciferol (VITAMIN D2) 1.25 MG (50000 UT)  capsule   Antibiotic-induced yeast infection       Relevant Medications   fluconazole (DIFLUCAN) 150 MG tablet      Outpatient Encounter Medications as of 07/08/2020  Medication Sig  . amLODipine (NORVASC) 5 MG tablet Take 1 tablet (5 mg total) by mouth daily.  Marland Kitchen aspirin EC 81 MG tablet Take 81 mg by mouth daily. Swallow whole.  . ergocalciferol (VITAMIN D2) 1.25 MG (50000 UT) capsule Take 50,000 Units by mouth once a week.  . metoprolol succinate (TOPROL-XL) 100 MG 24 hr tablet Take 1 tablet (100 mg total) by mouth daily. Take with or immediately following a meal.  . [DISCONTINUED] amLODipine (NORVASC) 5 MG tablet TAKE 1 TABLET BY MOUTH DAILY (Patient taking differently: 10 mg. )  . [DISCONTINUED] Blood Pressure KIT 1 each by Does not apply route 3 (three) times a week.  . [DISCONTINUED] metoprolol succinate (TOPROL-XL) 100 MG 24 hr tablet Take 100 mg by mouth daily. Take with or immediately following a meal.  . amoxicillin-clavulanate (AUGMENTIN) 875-125 MG tablet Take 1 tablet by mouth 2 (two) times daily for 10 days.  . fluconazole (DIFLUCAN) 150 MG tablet Take 1 tablet (150 mg total) by mouth once for 1 dose.  . [DISCONTINUED] metoprolol succinate (TOPROL-XL) 25 MG 24 hr tablet Take 1 tablet (25 mg total) by mouth daily.   No facility-administered encounter medications on file as of 07/08/2020.    Follow-up: Return in about 2 months (around 09/07/2020).   Vevelyn Francois, NP

## 2020-07-08 NOTE — Patient Instructions (Signed)
Sinusitis, Adult Sinusitis is soreness and swelling (inflammation) of your sinuses. Sinuses are hollow spaces in the bones around your face. They are located:  Around your eyes.  In the middle of your forehead.  Behind your nose.  In your cheekbones. Your sinuses and nasal passages are lined with a fluid called mucus. Mucus drains out of your sinuses. Swelling can trap mucus in your sinuses. This lets germs (bacteria, virus, or fungus) grow, which leads to infection. Most of the time, this condition is caused by a virus. What are the causes? This condition is caused by:  Allergies.  Asthma.  Germs.  Things that block your nose or sinuses.  Growths in the nose (nasal polyps).  Chemicals or irritants in the air.  Fungus (rare). What increases the risk? You are more likely to develop this condition if:  You have a weak body defense system (immune system).  You do a lot of swimming or diving.  You use nasal sprays too much.  You smoke. What are the signs or symptoms? The main symptoms of this condition are pain and a feeling of pressure around the sinuses. Other symptoms include:  Stuffy nose (congestion).  Runny nose (drainage).  Swelling and warmth in the sinuses.  Headache.  Toothache.  A cough that may get worse at night.  Mucus that collects in the throat or the back of the nose (postnasal drip).  Being unable to smell and taste.  Being very tired (fatigue).  A fever.  Sore throat.  Bad breath. How is this diagnosed? This condition is diagnosed based on:  Your symptoms.  Your medical history.  A physical exam.  Tests to find out if your condition is short-term (acute) or long-term (chronic). Your doctor may: ? Check your nose for growths (polyps). ? Check your sinuses using a tool that has a light (endoscope). ? Check for allergies or germs. ? Do imaging tests, such as an MRI or CT scan. How is this treated? Treatment for this condition  depends on the cause and whether it is short-term or long-term.  If caused by a virus, your symptoms should go away on their own within 10 days. You may be given medicines to relieve symptoms. They include: ? Medicines that shrink swollen tissue in the nose. ? Medicines that treat allergies (antihistamines). ? A spray that treats swelling of the nostrils. ? Rinses that help get rid of thick mucus in your nose (nasal saline washes).  If caused by bacteria, your doctor may wait to see if you will get better without treatment. You may be given antibiotic medicine if you have: ? A very bad infection. ? A weak body defense system.  If caused by growths in the nose, you may need to have surgery. Follow these instructions at home: Medicines  Take, use, or apply over-the-counter and prescription medicines only as told by your doctor. These may include nasal sprays.  If you were prescribed an antibiotic medicine, take it as told by your doctor. Do not stop taking the antibiotic even if you start to feel better. Hydrate and humidify   Drink enough water to keep your pee (urine) pale yellow.  Use a cool mist humidifier to keep the humidity level in your home above 50%.  Breathe in steam for 10-15 minutes, 3-4 times a day, or as told by your doctor. You can do this in the bathroom while a hot shower is running.  Try not to spend time in cool or dry air.   Rest  Rest as much as you can.  Sleep with your head raised (elevated).  Make sure you get enough sleep each night. General instructions   Put a warm, moist washcloth on your face 3-4 times a day, or as often as told by your doctor. This will help with discomfort.  Wash your hands often with soap and water. If there is no soap and water, use hand sanitizer.  Do not smoke. Avoid being around people who are smoking (secondhand smoke).  Keep all follow-up visits as told by your doctor. This is important. Contact a doctor if:  You  have a fever.  Your symptoms get worse.  Your symptoms do not get better within 10 days. Get help right away if:  You have a very bad headache.  You cannot stop throwing up (vomiting).  You have very bad pain or swelling around your face or eyes.  You have trouble seeing.  You feel confused.  Your neck is stiff.  You have trouble breathing. Summary  Sinusitis is swelling of your sinuses. Sinuses are hollow spaces in the bones around your face.  This condition is caused by tissues in your nose that become inflamed or swollen. This traps germs. These can lead to infection.  If you were prescribed an antibiotic medicine, take it as told by your doctor. Do not stop taking it even if you start to feel better.  Keep all follow-up visits as told by your doctor. This is important. This information is not intended to replace advice given to you by your health care provider. Make sure you discuss any questions you have with your health care provider. Document Revised: 03/11/2018 Document Reviewed: 03/11/2018 Elsevier Patient Education  2020 [employer].    Allergies, Adult An allergy means that your body reacts to something that bothers it (allergen). It is not a normal reaction. This can happen from something that you:  Eat.  Breathe in.  Touch. You can have an allergy (be allergic) to:  Outdoor things, like: ? Pollen. ? Grass. ? Weeds.  Indoor things, like: ? Dust. ? Smoke. ? Pet dander.  Foods.  Medicines.  Things that bother your skin, like: ? Detergents. ? Chemicals. ? Latex.  Perfume.  Bugs. An allergy cannot spread from person to person (is not contagious). Follow these instructions at home:         Stay away from things that you know you are allergic to.  If you have allergies to things in the air, wash out your nose each day. Do it with one of these: ? A salt-water (saline) spray. ? A container (neti pot).  Take over-the-counter and  prescription medicines only as told by your doctor.  Keep all follow-up visits as told by your doctor. This is important.  If you are at risk for a very bad allergy reaction (anaphylaxis), keep an auto-injector with you all the time. This is called an epinephrine injection. ? This is pre-measured medicine with a needle. You can put it into your skin by yourself. ? Right after you have a very bad allergy reaction, you or a person with you must give the medicine in less than a few minutes. This is an emergency.  If you have ever had a very bad allergy reaction, wear a medical alert bracelet or necklace. Your very bad allergy should be written on it. Contact a health care provider if:  Your symptoms do not get better with treatment. Get help right away if:  You have symptoms of a very bad allergy reaction. These include: ? A swollen mouth, tongue, or throat. ? Pain or tightness in your chest. ? Trouble breathing. ? Being short of breath. ? Dizziness. ? Fainting. ? Very bad pain in your belly (abdomen). ? Throwing up (vomiting). ? Watery poop (diarrhea). Summary  An allergy means that your body reacts to something that bothers it (allergen). It is not a normal reaction.  Stay away from things that make your body react.  Take over-the-counter and prescription medicines only as told by your doctor.  If you are at risk for a very bad allergy reaction, carry an auto-injector (epinephrine injection) all the time. Also, wear a medical alert bracelet or necklace so people know about your allergy. This information is not intended to replace advice given to you by your health care provider. Make sure you discuss any questions you have with your health care provider. Document Revised: 01/28/2019 Document Reviewed: 01/22/2017 Elsevier Patient Education  [employer].

## 2020-09-15 ENCOUNTER — Encounter: Payer: Self-pay | Admitting: Nurse Practitioner

## 2020-09-15 ENCOUNTER — Other Ambulatory Visit: Payer: Self-pay

## 2020-09-15 ENCOUNTER — Ambulatory Visit (INDEPENDENT_AMBULATORY_CARE_PROVIDER_SITE_OTHER): Payer: 59 | Admitting: Nurse Practitioner

## 2020-09-15 VITALS — BP 131/81 | HR 94 | Temp 99.3°F | Resp 16 | Ht 60.0 in | Wt 185.0 lb

## 2020-09-15 DIAGNOSIS — Z131 Encounter for screening for diabetes mellitus: Secondary | ICD-10-CM

## 2020-09-15 DIAGNOSIS — Z23 Encounter for immunization: Secondary | ICD-10-CM

## 2020-09-15 DIAGNOSIS — Z1329 Encounter for screening for other suspected endocrine disorder: Secondary | ICD-10-CM

## 2020-09-15 DIAGNOSIS — R7303 Prediabetes: Secondary | ICD-10-CM

## 2020-09-15 DIAGNOSIS — Z130 Encounter for screening for diseases of the blood and blood-forming organs and certain disorders involving the immune mechanism: Secondary | ICD-10-CM

## 2020-09-15 DIAGNOSIS — I10 Essential (primary) hypertension: Secondary | ICD-10-CM

## 2020-09-15 DIAGNOSIS — Z0000 Encounter for general adult medical examination without abnormal findings: Secondary | ICD-10-CM

## 2020-09-15 DIAGNOSIS — Z13220 Encounter for screening for lipoid disorders: Secondary | ICD-10-CM

## 2020-09-15 LAB — POCT GLYCOSYLATED HEMOGLOBIN (HGB A1C): Hemoglobin A1C: 6.4 % — AB (ref 4.0–5.6)

## 2020-09-15 LAB — GLUCOSE, POCT (MANUAL RESULT ENTRY): POC Glucose: 272 mg/dl — AB (ref 70–99)

## 2020-09-15 MED ORDER — AMLODIPINE BESYLATE 10 MG PO TABS: 10.0000 mg | ORAL_TABLET | Freq: Every day | ORAL | 3 refills | Status: DC

## 2020-09-15 NOTE — Progress Notes (Signed)
[place], [place]  [address] Phone:  [phone]   Fax:  [phone]  Established Patient Office Visit  Subjective:  Patient ID: Lacey Carrillo, female    DOB: 10-09-1986  Age: 34 y.o. MRN: [id number]  CC:  Chief Complaint  Patient presents with   Annual Exam    HPI Lacey Carrillo presents for annual exam. She  has a past medical history of Hypertension.     She is a 34 y.o. female who presents for an annual exam. The patient has no complaints today. Denies dizziness, visual changes, shortness of breath, dyspnea on exertion, chest pain, nausea, vomiting or any edema.    Past Medical History:  Diagnosis Date   Hypertension     No past surgical history on file.  Family History  Problem Relation Age of Onset   Diabetes Mother    Diabetes Father     Social History   Socioeconomic History   Marital status: Single    Spouse name: Not on file   Number of children: Not on file   Years of education: Not on file   Highest education level: Not on file  Occupational History   Not on file  Tobacco Use   Smoking status: Never Smoker   Smokeless tobacco: Never Used  Substance and Sexual Activity   Alcohol use: No    Alcohol/week: 0.0 standard drinks   Drug use: No   Sexual activity: Not Currently  Other Topics Concern   Not on file  Social History Narrative   Not on file   Social Determinants of Health   Financial Resource Strain:    Difficulty of Paying Living Expenses: Not on file  Food Insecurity:    Worried About [place] in the Last Year: Not on file   Ran Out of Food in the Last Year: Not on file  Transportation Needs:    Lack of Transportation (Medical): Not on file   Lack of Transportation (Non-Medical): Not on file  Physical Activity:    Days of Exercise per Week: Not on file   Minutes of Exercise per Session: Not on file  Stress:    Feeling of Stress : Not on file   Social Connections:    Frequency of Communication with Friends and Family: Not on file   Frequency of Social Gatherings with Friends and Family: Not on file   Attends Religious Services: Not on file   Active Member of Clubs or Organizations: Not on file   Attends Archivist Meetings: Not on file   Marital Status: Not on file  Intimate Partner Violence:    Fear of Current or Ex-Partner: Not on file   Emotionally Abused: Not on file   Physically Abused: Not on file   Sexually Abused: Not on file    Outpatient Medications Prior to Visit  Medication Sig Dispense Refill   aspirin EC 81 MG tablet Take 81 mg by mouth daily. Swallow whole.     ergocalciferol (VITAMIN D2) 1.25 MG (50000 UT) capsule Take 50,000 Units by mouth once a week.     metoprolol succinate (TOPROL-XL) 100 MG 24 hr tablet Take 1 tablet (100 mg total) by mouth daily. Take with or immediately following a meal. 90 tablet 3   amLODipine (NORVASC) 5 MG tablet Take 1 tablet (5 mg total) by mouth daily. 90 tablet 3   No facility-administered medications prior to visit.    No Known  Allergies  ROS Review of Systems  Genitourinary:       Nocturia  Neurological: Positive for headaches.      Objective:    Physical Exam Constitutional:      General: She is not in acute distress.    Appearance: She is obese. She is not ill-appearing, toxic-appearing or diaphoretic.  HENT:     Head: Normocephalic and atraumatic.  Cardiovascular:     Rate and Rhythm: Normal rate and regular rhythm.  Musculoskeletal:     Cervical back: Normal range of motion.  Neurological:     Mental Status: She is alert.     BP 131/81 (BP Location: Right Arm, Patient Position: Sitting, Cuff Size: Normal)    Pulse 94    Temp 99.3 F (37.4 C) (Temporal)    Resp 16    Ht 5' (1.524 m)    Wt 185 lb (83.9 kg)    SpO2 100%    BMI 36.13 kg/m  Wt Readings from Last 3 Encounters:  09/15/20 185 lb (83.9 kg)  07/08/20 180 lb (81.6  kg)  08/22/18 189 lb (85.7 kg)     There are no preventive care reminders to display for this patient.  There are no preventive care reminders to display for this patient.  Lab Results  Component Value Date   TSH 0.652 09/15/2020   Lab Results  Component Value Date   WBC 8.3 09/15/2020   HGB 13.5 09/15/2020   HCT 40.7 09/15/2020   MCV 84 09/15/2020   PLT 274 09/15/2020   Lab Results  Component Value Date   NA 135 09/15/2020   K 4.1 09/15/2020   CO2 27 01/03/2016   GLUCOSE 223 (H) 09/15/2020   BUN 12 09/15/2020   CREATININE 0.70 09/15/2020   BILITOT 0.3 09/15/2020   ALKPHOS 131 (H) 09/15/2020   AST 14 09/15/2020   ALT 15 01/03/2016   PROT 7.4 09/15/2020   ALBUMIN 4.2 09/15/2020   CALCIUM 9.7 09/15/2020   Lab Results  Component Value Date   CHOL 132 09/15/2020   Lab Results  Component Value Date   HDL 50 09/15/2020   Lab Results  Component Value Date   LDLCALC 67 09/15/2020   Lab Results  Component Value Date   TRIG 73 09/15/2020   Lab Results  Component Value Date   CHOLHDL 2.6 09/15/2020   Lab Results  Component Value Date   HGBA1C 6.4 (A) 09/15/2020      Assessment & Plan:   Problem List Items Addressed This Visit    None    Visit Diagnoses    Screening for diabetes mellitus    -  Primary   Relevant Orders   Comp. Metabolic Panel (12) (Completed)   HgB A1c (Completed)   Glucose (CBG) (Completed)   Essential hypertension     Encouraged on going compliance with current medication regimen Encouraged home monitoring and recording BP <130/80 Eating a heart-healthy diet with less salt.  Encouraged regular physical activity  Recommend Weight loss    Relevant Medications   amLODipine (NORVASC) 10 MG tablet   Other Relevant Orders   Comp. Metabolic Panel (12) (Completed)   Screening for deficiency anemia       Relevant Orders   CBC with Differential/Platelet (Completed)   Iron, TIBC and Ferritin Panel (Completed)   Screening for thyroid  disorder       Relevant Orders   TSH (Completed)   Screening for cholesterol level       Relevant  Orders   Lipid panel (Completed)   Need for influenza vaccination       Routine general medical examination at a health care facility    Female health maintenance discussed  Encouraged regular dietary intake and hydration with water to prevent dehydration    Prediabetes     Weight loss at least 5% of current body weight is can be achieved with lifestyle modification dietary changes and regular daily exercise Encourage blood pressure control goal <120/80 and maintaining total cholesterol <200 Follow-up every 3 to 6 months for reevaluation       Meds ordered this encounter  Medications   amLODipine (NORVASC) 10 MG tablet    Sig: Take 1 tablet (10 mg total) by mouth daily.    Dispense:  90 tablet    Refill:  3    Order Specific Question:   Supervising Provider    Answer:   Tresa Garter [[id number]]    Follow-up: Return in 1 year (on 09/15/2021).    Vevelyn Francois, NP

## 2020-09-15 NOTE — Patient Instructions (Signed)
Health Maintenance, Female Adopting a healthy lifestyle and getting preventive care are important in promoting health and wellness. Ask your health care provider about:  The right schedule for you to have regular tests and exams.  Things you can do on your own to prevent diseases and keep yourself healthy. What should I know about diet, weight, and exercise? Eat a healthy diet   Eat a diet that includes plenty of vegetables, fruits, low-fat dairy products, and lean protein.  Do not eat a lot of foods that are high in solid fats, added sugars, or sodium. Maintain a healthy weight Body mass index (BMI) is used to identify weight problems. It estimates body fat based on height and weight. Your health care provider can help determine your BMI and help you achieve or maintain a healthy weight. Get regular exercise Get regular exercise. This is one of the most important things you can do for your health. Most adults should:  Exercise for at least 150 minutes each week. The exercise should increase your heart rate and make you sweat (moderate-intensity exercise).  Do strengthening exercises at least twice a week. This is in addition to the moderate-intensity exercise.  Spend less time sitting. Even light physical activity can be beneficial. Watch cholesterol and blood lipids Have your blood tested for lipids and cholesterol at 34 years of age, then have this test every 5 years. Have your cholesterol levels checked more often if:  Your lipid or cholesterol levels are high.  You are older than 34 years of age.  You are at high risk for heart disease. What should I know about cancer screening? Depending on your health history and family history, you may need to have cancer screening at various ages. This may include screening for:  Breast cancer.  Cervical cancer.  Colorectal cancer.  Skin cancer.  Lung cancer. What should I know about heart disease, diabetes, and high blood  pressure? Blood pressure and heart disease  High blood pressure causes heart disease and increases the risk of stroke. This is more likely to develop in people who have high blood pressure readings, are of African descent, or are overweight.  Have your blood pressure checked: ? Every 3-5 years if you are 18-39 years of age. ? Every year if you are 40 years old or older. Diabetes Have regular diabetes screenings. This checks your fasting blood sugar level. Have the screening done:  Once every three years after age 40 if you are at a normal weight and have a low risk for diabetes.  More often and at a younger age if you are overweight or have a high risk for diabetes. What should I know about preventing infection? Hepatitis B If you have a higher risk for hepatitis B, you should be screened for this virus. Talk with your health care provider to find out if you are at risk for hepatitis B infection. Hepatitis C Testing is recommended for:  Everyone born from 1945 through 1965.  Anyone with known risk factors for hepatitis C. Sexually transmitted infections (STIs)  Get screened for STIs, including gonorrhea and chlamydia, if: ? You are sexually active and are younger than 34 years of age. ? You are older than 34 years of age and your health care provider tells you that you are at risk for this type of infection. ? Your sexual activity has changed since you were last screened, and you are at increased risk for chlamydia or gonorrhea. Ask your health care provider if   you are at risk.  Ask your health care provider about whether you are at high risk for HIV. Your health care provider may recommend a prescription medicine to help prevent HIV infection. If you choose to take medicine to prevent HIV, you should first get tested for HIV. You should then be tested every 3 months for as long as you are taking the medicine. Pregnancy  If you are about to stop having your period (premenopausal) and  you may become pregnant, seek counseling before you get pregnant.  Take 400 to 800 micrograms (mcg) of folic acid every day if you become pregnant.  Ask for birth control (contraception) if you want to prevent pregnancy. Osteoporosis and menopause Osteoporosis is a disease in which the bones lose minerals and strength with aging. This can result in bone fractures. If you are 65 years old or older, or if you are at risk for osteoporosis and fractures, ask your health care provider if you should:  Be screened for bone loss.  Take a calcium or vitamin D supplement to lower your risk of fractures.  Be given hormone replacement therapy (HRT) to treat symptoms of menopause. Follow these instructions at home: Lifestyle  Do not use any products that contain nicotine or tobacco, such as cigarettes, e-cigarettes, and chewing tobacco. If you need help quitting, ask your health care provider.  Do not use street drugs.  Do not share needles.  Ask your health care provider for help if you need support or information about quitting drugs. Alcohol use  Do not drink alcohol if: ? Your health care provider tells you not to drink. ? You are pregnant, may be pregnant, or are planning to become pregnant.  If you drink alcohol: ? Limit how much you use to 0-1 drink a day. ? Limit intake if you are breastfeeding.  Be aware of how much alcohol is in your drink. In the [place] one drink equals one 12 oz bottle of beer (355 mL), one 5 oz glass of wine (148 mL), or one 1 oz glass of hard liquor (44 mL). General instructions  Schedule regular health, dental, and eye exams.  Stay current with your vaccines.  Tell your health care provider if: ? You often feel depressed. ? You have ever been abused or do not feel safe at home. Summary  Adopting a healthy lifestyle and getting preventive care are important in promoting health and wellness.  Follow your health care provider's instructions about healthy  diet, exercising, and getting tested or screened for diseases.  Follow your health care provider's instructions on monitoring your cholesterol and blood pressure. This information is not intended to replace advice given to you by your health care provider. Make sure you discuss any questions you have with your health care provider. Document Revised: 10/02/2018 Document Reviewed: 10/02/2018 Elsevier Patient Education  2020 Elsevier Inc.  

## 2020-09-16 ENCOUNTER — Encounter: Payer: Self-pay | Admitting: Nurse Practitioner

## 2020-09-16 LAB — LIPID PANEL
Chol/HDL Ratio: 2.6 ratio (ref 0.0–4.4)
Cholesterol, Total: 132 mg/dL (ref 100–199)
HDL: 50 mg/dL (ref >39)
LDL Chol Calc (NIH): 67 mg/dL (ref 0–99)
Triglycerides: 73 mg/dL (ref 0–149)
VLDL Cholesterol Cal: 15 mg/dL (ref 5–40)

## 2020-09-16 LAB — CBC WITH DIFFERENTIAL/PLATELET
Basophils Absolute: 0 10*3/uL (ref 0.0–0.2)
Basos: 0 %
EOS (ABSOLUTE): 0.1 10*3/uL (ref 0.0–0.4)
Eos: 1 %
Hematocrit: 40.7 % (ref 34.0–46.6)
Hemoglobin: 13.5 g/dL (ref 11.1–15.9)
Immature Grans (Abs): 0 10*3/uL (ref 0.0–0.1)
Immature Granulocytes: 0 %
Lymphocytes Absolute: 1.6 10*3/uL (ref 0.7–3.1)
Lymphs: 19 %
MCH: 28 pg (ref 26.6–33.0)
MCHC: 33.2 g/dL (ref 31.5–35.7)
MCV: 84 fL (ref 79–97)
Monocytes Absolute: 0.6 10*3/uL (ref 0.1–0.9)
Monocytes: 7 %
Neutrophils Absolute: 6 10*3/uL (ref 1.4–7.0)
Neutrophils: 73 %
Platelets: 274 10*3/uL (ref 150–450)
RBC: 4.82 x10E6/uL (ref 3.77–5.28)
RDW: 12.3 % (ref 11.7–15.4)
WBC: 8.3 10*3/uL (ref 3.4–10.8)

## 2020-09-16 LAB — COMP. METABOLIC PANEL (12)
AST: 14 IU/L (ref 0–40)
Albumin/Globulin Ratio: 1.3 (ref 1.2–2.2)
Albumin: 4.2 g/dL (ref 3.8–4.8)
Alkaline Phosphatase: 131 IU/L — ABNORMAL HIGH (ref 44–121)
BUN/Creatinine Ratio: 17 (ref 9–23)
BUN: 12 mg/dL (ref 6–20)
Bilirubin Total: 0.3 mg/dL (ref 0.0–1.2)
Calcium: 9.7 mg/dL (ref 8.7–10.2)
Chloride: 101 mmol/L (ref 96–106)
Creatinine, Ser: 0.7 mg/dL (ref 0.57–1.00)
GFR calc Af Amer: 131 mL/min/{1.73_m2} (ref >59)
GFR calc non Af Amer: 113 mL/min/{1.73_m2} (ref >59)
Globulin, Total: 3.2 g/dL (ref 1.5–4.5)
Glucose: 223 mg/dL — ABNORMAL HIGH (ref 65–99)
Potassium: 4.1 mmol/L (ref 3.5–5.2)
Sodium: 135 mmol/L (ref 134–144)
Total Protein: 7.4 g/dL (ref 6.0–8.5)

## 2020-09-16 LAB — IRON,TIBC AND FERRITIN PANEL
Ferritin: 166 ng/mL — ABNORMAL HIGH (ref 15–150)
Iron Saturation: 19 % (ref 15–55)
Iron: 55 ug/dL (ref 27–159)
Total Iron Binding Capacity: 286 ug/dL (ref 250–450)
UIBC: 231 ug/dL (ref 131–425)

## 2020-09-16 LAB — TSH: TSH: 0.652 u[IU]/mL (ref 0.450–4.500)

## 2020-09-20 MED ORDER — BLOOD GLUCOSE MONITOR KIT: PACK | 0 refills | Status: DC

## 2021-02-22 ENCOUNTER — Other Ambulatory Visit: Payer: Self-pay | Admitting: Internal Medicine

## 2021-02-23 LAB — COMPLETE METABOLIC PANEL WITH GFR
AG Ratio: 1.3 (calc) (ref 1.0–2.5)
ALT: 22 U/L (ref 6–29)
AST: 19 U/L (ref 10–30)
Albumin: 4.3 g/dL (ref 3.6–5.1)
Alkaline phosphatase (APISO): 89 U/L (ref 31–125)
BUN: 14 mg/dL (ref 7–25)
CO2: 20 mmol/L (ref 20–32)
Calcium: 10 mg/dL (ref 8.6–10.2)
Chloride: 100 mmol/L (ref 98–110)
Creat: 0.68 mg/dL (ref 0.50–1.10)
GFR, Est African American: 132 mL/min/{1.73_m2} (ref >=60)
GFR, Est Non African American: 114 mL/min/{1.73_m2} (ref >=60)
Globulin: 3.4 g/dL (calc) (ref 1.9–3.7)
Glucose, Bld: 81 mg/dL (ref 65–99)
Potassium: 3.5 mmol/L (ref 3.5–5.3)
Sodium: 135 mmol/L (ref 135–146)
Total Bilirubin: 0.6 mg/dL (ref 0.2–1.2)
Total Protein: 7.7 g/dL (ref 6.1–8.1)

## 2021-02-23 LAB — CBC
HCT: 42.6 % (ref 35.0–45.0)
Hemoglobin: 13.8 g/dL (ref 11.7–15.5)
MCH: 27.6 pg (ref 27.0–33.0)
MCHC: 32.4 g/dL (ref 32.0–36.0)
MCV: 85.2 fL (ref 80.0–100.0)
MPV: 10.2 fL (ref 7.5–12.5)
Platelets: 309 10*3/uL (ref 140–400)
RBC: 5 10*6/uL (ref 3.80–5.10)
RDW: 12.1 % (ref 11.0–15.0)
WBC: 9.9 10*3/uL (ref 3.8–10.8)

## 2021-02-23 LAB — LIPID PANEL
Cholesterol: 148 mg/dL (ref <200)
HDL: 59 mg/dL (ref >=50)
LDL Cholesterol (Calc): 75 mg/dL (calc)
Non-HDL Cholesterol (Calc): 89 mg/dL (calc) (ref <130)
Total CHOL/HDL Ratio: 2.5 (calc) (ref <5.0)
Triglycerides: 63 mg/dL (ref <150)

## 2021-02-23 LAB — CLIENT EDUCATION TRACKING

## 2021-02-23 LAB — TSH: TSH: 0.78 mIU/L

## 2021-02-23 LAB — VITAMIN D 25 HYDROXY (VIT D DEFICIENCY, FRACTURES): Vit D, 25-Hydroxy: 53 ng/mL (ref 30–100)

## 2021-05-18 ENCOUNTER — Encounter: Payer: Self-pay | Admitting: Dermatology

## 2021-05-18 ENCOUNTER — Other Ambulatory Visit: Payer: Self-pay

## 2021-05-18 ENCOUNTER — Ambulatory Visit (INDEPENDENT_AMBULATORY_CARE_PROVIDER_SITE_OTHER): Payer: 59 | Admitting: Dermatology

## 2021-05-18 DIAGNOSIS — L910 Hypertrophic scar: Secondary | ICD-10-CM | POA: Diagnosis not present

## 2021-05-18 MED ORDER — TRIAMCINOLONE ACETONIDE 40 MG/ML IJ SUSP
40.0000 mg | Freq: Once | INTRAMUSCULAR | Status: AC
  Administered 2021-05-18: 40 mg

## 2021-06-05 ENCOUNTER — Encounter: Payer: Self-pay | Admitting: Dermatology

## 2021-06-05 NOTE — Progress Notes (Signed)
   New Patient   Subjective  Lacey Carrillo is a 35 y.o. 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  Keloid chest Location:  Duration:  Quality:  Associated Signs/Symptoms: Modifying Factors:  Severity:  Timing: Context:    The following portions of the chart were reviewed this encounter and updated as appropriate:  Tobacco  Allergies  Meds  Problems  Med Hx  Surg Hx  Fam Hx      Objective  Well appearing patient in no apparent distress; mood and affect are within normal limits. Chest - [facility]) Symptomatic dermal thickening compatible with ganglion.  Allergy explained avoid unnecessary procedures treatment options discussed benign neglect, and a series of 2-6 intralesional triamcinolone injections in surgery with SRT.    A focused examination was performed including head, neck, ears, chest. Relevant physical exam findings are noted in the Assessment and Plan.   Assessment & Plan  Keloid Chest - Medial [facility])  We will proceed with '40mg'$ /cc triamcinolone intralesional.  Plan to re-inject in 1-2 months.  Intralesional injection - Chest - [facility]) Location: Chest  Informed Consent: Discussed risks (infection, pain, bleeding, bruising, thinning of the skin, loss of skin pigment, lack of resolution, and recurrence of lesion) and benefits of the procedure, as well as the alternatives. Informed consent was obtained.  Preparation: The area was prepared a standard fashion.  Procedure Details: An intralesional injection was performed with Kenalog 40 mg/cc. 0.1 cc in total were injected.  Total number of injections: 2  Plan: The patient was instructed on post-op care. Recommend OTC analgesia as needed for pain.   triamcinolone acetonide (KENALOG-40) injection 40 mg - Chest - [facility]ial [facility])

## 2021-06-23 ENCOUNTER — Other Ambulatory Visit: Payer: Self-pay

## 2021-06-23 DIAGNOSIS — I10 Essential (primary) hypertension: Secondary | ICD-10-CM

## 2021-06-23 MED ORDER — METOPROLOL SUCCINATE ER 100 MG PO TB24: 100.0000 mg | ORAL_TABLET | Freq: Every day | ORAL | 3 refills | Status: DC

## 2021-07-01 ENCOUNTER — Ambulatory Visit: Payer: 59 | Admitting: Nurse Practitioner

## 2021-08-03 ENCOUNTER — Ambulatory Visit: Payer: 59 | Admitting: Dermatology

## 2021-08-24 ENCOUNTER — Ambulatory Visit: Payer: 59 | Admitting: Dermatology

## 2021-08-24 ENCOUNTER — Other Ambulatory Visit: Payer: Self-pay

## 2021-08-24 ENCOUNTER — Encounter: Payer: Self-pay | Admitting: Nurse Practitioner

## 2021-08-24 ENCOUNTER — Ambulatory Visit (INDEPENDENT_AMBULATORY_CARE_PROVIDER_SITE_OTHER): Payer: 59 | Admitting: Nurse Practitioner

## 2021-08-24 VITALS — BP 128/86 | HR 89 | Temp 98.4°F | Ht 60.0 in | Wt 172.2 lb

## 2021-08-24 DIAGNOSIS — I10 Essential (primary) hypertension: Secondary | ICD-10-CM | POA: Diagnosis not present

## 2021-08-24 DIAGNOSIS — Z23 Encounter for immunization: Secondary | ICD-10-CM | POA: Diagnosis not present

## 2021-08-24 DIAGNOSIS — R7303 Prediabetes: Secondary | ICD-10-CM

## 2021-08-24 DIAGNOSIS — Z0000 Encounter for general adult medical examination without abnormal findings: Secondary | ICD-10-CM | POA: Diagnosis not present

## 2021-08-24 LAB — POCT GLYCOSYLATED HEMOGLOBIN (HGB A1C)
HbA1c POC (<> result, manual entry): 6.1 % (ref 4.0–5.6)
HbA1c, POC (controlled diabetic range): 6.1 % (ref 0.0–7.0)
HbA1c, POC (prediabetic range): 6.1 % (ref 5.7–6.4)
Hemoglobin A1C: 6.1 % — AB (ref 4.0–5.6)

## 2021-08-24 LAB — POCT URINALYSIS DIP (CLINITEK)
Bilirubin, UA: NEGATIVE
Blood, UA: NEGATIVE
Glucose, UA: NEGATIVE mg/dL
Ketones, POC UA: NEGATIVE mg/dL
Leukocytes, UA: NEGATIVE
Nitrite, UA: NEGATIVE
POC PROTEIN,UA: NEGATIVE
Spec Grav, UA: 1.025 (ref 1.010–1.025)
Urobilinogen, UA: 0.2 E.U./dL
pH, UA: 5.5 (ref 5.0–8.0)

## 2021-08-24 MED ORDER — AMLODIPINE BESYLATE 10 MG PO TABS: 10.0000 mg | ORAL_TABLET | Freq: Every day | ORAL | 3 refills | Status: DC

## 2021-08-24 MED ORDER — METOPROLOL SUCCINATE ER 100 MG PO TB24: 100.0000 mg | ORAL_TABLET | Freq: Every day | ORAL | 3 refills | Status: DC

## 2021-08-24 NOTE — Progress Notes (Signed)
[place], [place]  [address] Phone:  [phone]   Fax:  [phone]   Established Patient Office Visit  Subjective:  Patient ID: Lacey Carrillo, female    DOB: 1986-05-24  Age: 35 y.o. MRN: [id number]  CC:  Chief Complaint  Patient presents with   Follow-up    Pt is here today for a medical clearance for an upcoming surgery she will be having on September 11, 2021. Pt is having a myomectomy.    HPI Lacey Carrillo presents for follow up. Her last follow up visit was 08/2020. She  has a past medical history of Hypertension.   She is into for a follow up for her HTN and prediabetes. She is compliant with Amlodipine 5 mg and metoprolol 100 mg. She has been on this regimen for >5 yrs. She has been making some lifestyle changes but reports a need to improve on her diet to prevent diabetes. She has had a >10 pound weight loss.   She reports plans to have a abdominal myomectomy due to growth of fibroids. She has an IUD in place to control the bleeding. She reports urinary frequency.   Past Medical History:  Diagnosis Date   Hypertension     History reviewed. No pertinent surgical history.  Family History  Problem Relation Age of Onset   Diabetes Mother    Diabetes Father     Social History   Socioeconomic History   Marital status: Single    Spouse name: Not on file   Number of children: Not on file   Years of education: Not on file   Highest education level: Not on file  Occupational History   Not on file  Tobacco Use   Smoking status: Never   Smokeless tobacco: Never  Vaping Use   Vaping Use: Never used  Substance and Sexual Activity   Alcohol use: No    Alcohol/week: 0.0 standard drinks   Drug use: No   Sexual activity: Not Currently  Other Topics Concern   Not on file  Social History Narrative   Not on file   Social Determinants of Health   Financial Resource Strain: Not on file  Food Insecurity: Not on file   Transportation Needs: Not on file  Physical Activity: Not on file  Stress: Not on file  Social Connections: Not on file  Intimate Partner Violence: Not on file    Outpatient Medications Prior to Visit  Medication Sig Dispense Refill   aspirin EC 81 MG tablet Take 81 mg by mouth daily. Swallow whole.     cetirizine (ZYRTEC) 10 MG tablet SMARTSIG:1 Tablet(s) By Mouth Every Evening PRN     ergocalciferol (VITAMIN D2) 1.25 MG (50000 UT) capsule Take 50,000 Units by mouth once a week.     amLODipine (NORVASC) 10 MG tablet Take 1 tablet (10 mg total) by mouth daily. 90 tablet 3   metoprolol succinate (TOPROL-XL) 100 MG 24 hr tablet Take 1 tablet (100 mg total) by mouth daily. Take with or immediately following a meal. 90 tablet 3   blood glucose meter kit and supplies KIT Dispense based on patient and insurance preference. Use up to four times daily as directed. (FOR ICD-9 250.00, 250.01). (Patient not taking: Reported on 08/24/2021) 1 each 0   glucose blood test strip OneTouch Ultra Test strips  USE UP TO FOUR TIMES A DAY AS DIRECTED (Patient not taking: Reported on 08/24/2021)     No facility-administered  medications prior to visit.    No Known Allergies  ROS Review of Systems    Objective:    Physical Exam Constitutional:      Appearance: She is obese.  HENT:     Head: Normocephalic and atraumatic.     Nose: Nose normal.     Mouth/Throat:     Mouth: Mucous membranes are moist.  Cardiovascular:     Rate and Rhythm: Normal rate and regular rhythm.     Pulses: Normal pulses.     Heart sounds: Normal heart sounds.  Pulmonary:     Effort: Pulmonary effort is normal.     Breath sounds: Normal breath sounds.  Abdominal:     General: There is distension.     Tenderness: There is no abdominal tenderness. There is no guarding or rebound.     Comments: Firm   Musculoskeletal:        General: Normal range of motion.     Cervical back: Normal range of motion.     Right lower leg:  No edema.     Left lower leg: No edema.  Skin:    General: Skin is warm and dry.     Capillary Refill: Capillary refill takes less than 2 seconds.  Neurological:     General: No focal deficit present.     Mental Status: She is alert and oriented to person, place, and time.  Psychiatric:        Mood and Affect: Mood normal.        Behavior: Behavior normal.        Thought Content: Thought content normal.        Judgment: Judgment normal.    BP 128/86   Pulse 89   Temp 98.4 F (36.9 C)   Ht 5' (1.524 m)   Wt 172 lb 3.2 oz (78.1 kg)   SpO2 98%   BMI 33.63 kg/m  Wt Readings from Last 3 Encounters:  08/24/21 172 lb 3.2 oz (78.1 kg)  09/15/20 185 lb (83.9 kg)  07/08/20 180 lb (81.6 kg)     Health Maintenance Due  Topic Date Due   HIV Screening  Never done   Hepatitis C Screening  Never done   PAP SMEAR-Modifier  Never done   COVID-19 Vaccine (3 - Booster) 02/16/2020    There are no preventive care reminders to display for this patient.  Lab Results  Component Value Date   TSH 0.78 02/22/2021   Lab Results  Component Value Date   WBC 9.9 02/22/2021   HGB 13.8 02/22/2021   HCT 42.6 02/22/2021   MCV 85.2 02/22/2021   PLT 309 02/22/2021   Lab Results  Component Value Date   NA 135 02/22/2021   K 3.5 02/22/2021   CO2 20 02/22/2021   GLUCOSE 81 02/22/2021   BUN 14 02/22/2021   CREATININE 0.68 02/22/2021   BILITOT 0.6 02/22/2021   ALKPHOS 131 (H) 09/15/2020   AST 19 02/22/2021   ALT 22 02/22/2021   PROT 7.7 02/22/2021   ALBUMIN 4.2 09/15/2020   CALCIUM 10.0 02/22/2021   Lab Results  Component Value Date   CHOL 148 02/22/2021   Lab Results  Component Value Date   HDL 59 02/22/2021   Lab Results  Component Value Date   LDLCALC 75 02/22/2021   Lab Results  Component Value Date   TRIG 63 02/22/2021   Lab Results  Component Value Date   CHOLHDL 2.5 02/22/2021   Lab Results  Component  Value Date   HGBA1C 6.1 (A) 08/24/2021   HGBA1C 6.1  08/24/2021   HGBA1C 6.1 08/24/2021   HGBA1C 6.1 08/24/2021      Assessment & Plan:   Problem List Items Addressed This Visit       Cardiovascular and Mediastinum   Essential hypertension - Primary Encouraged on going compliance with current medication regimen Encouraged home monitoring and recording BP <130/80 Eating a heart-healthy diet with less salt Encouraged regular physical activity  Recommend Weight loss   Relevant Medications   amLODipine (NORVASC) 10 MG tablet   metoprolol succinate (TOPROL-XL) 100 MG 24 hr tablet     Other   Prediabetes Improved A1c 6.1% Consider home glucose monitoring Weight loss at least 5% of current body weight is can be achieved with lifestyle modification dietary changes and regular daily exercise Encourage blood pressure control goal <120/80 and maintaining total cholesterol <200 Follow-up every 3 to 6 months for reevaluation Education material provided    Relevant Orders   Ambulatory referral to Nutrition and Diabetic Education   Other Visit Diagnoses     Needs flu shot       Relevant Orders   Flu Vaccine QUAD 102moIM (Fluarix, Fluzone & Alfiuria Quad PF) (Completed)   Healthcare maintenance       Relevant Orders   POCT URINALYSIS DIP (CLINITEK) (Completed)   HgB A1c (Completed)       Meds ordered this encounter  Medications   amLODipine (NORVASC) 10 MG tablet    Sig: Take 1 tablet (10 mg total) by mouth daily.    Dispense:  90 tablet    Refill:  3    Order Specific Question:   Supervising Provider    Answer:   JTresa Garter[[id number]  metoprolol succinate (TOPROL-XL) 100 MG 24 hr tablet    Sig: Take 1 tablet (100 mg total) by mouth daily. Take with or immediately following a meal.    Dispense:  90 tablet    Refill:  3    Order Specific Question:   Supervising Provider    Answer:   JTresa Garter[[[id number]    Follow-up: Return in about 6 months (around 02/21/2022) for PHonalo    CVevelyn Francois NP

## 2021-08-24 NOTE — Patient Instructions (Addendum)
Managing Your Hypertension Hypertension, also called high blood pressure, is when the force of the blood pressing against the walls of the arteries is too strong. Arteries are blood vessels that carry blood from your heart throughout your body. Hypertension forces the heart to work harder to pump blood and may cause the arteries to become narrow or stiff. Understanding blood pressure readings Your personal target blood pressure may vary depending on your medical conditions, your age, and other factors. A blood pressure reading includes a higher number over a lower number. Ideally, your blood pressure should be below 120/80. You should know that: The first, or top, number is called the systolic pressure. It is a measure of the pressure in your arteries as your heart beats. The second, or bottom number, is called the diastolic pressure. It is a measure of the pressure in your arteries as the heart relaxes. Blood pressure is classified into four stages. Based on your blood pressure reading, your health care provider may use the following stages to determine what type of treatment you need, if any. Systolic pressure and diastolic pressure are measured in a unit called mmHg. Normal Systolic pressure: below 120. Diastolic pressure: below 80. Elevated Systolic pressure: 120-129. Diastolic pressure: below 80. Hypertension stage 1 Systolic pressure: 130-139. Diastolic pressure: 80-89. Hypertension stage 2 Systolic pressure: 140 or above. Diastolic pressure: 90 or above. How can this condition affect me? Managing your hypertension is an important responsibility. Over time, hypertension can damage the arteries and decrease blood flow to important parts of the body, including the brain, heart, and kidneys. Having untreated or uncontrolled hypertension can lead to: A heart attack. A stroke. A weakened blood vessel (aneurysm). Heart failure. Kidney damage. Eye damage. Metabolic syndrome. Memory and  concentration problems. Vascular dementia. What actions can I take to manage this condition? Hypertension can be managed by making lifestyle changes and possibly by taking medicines. Your health care provider will help you make a plan to bring your blood pressure within a normal range. Nutrition  Eat a diet that is high in fiber and potassium, and low in salt (sodium), added sugar, and fat. An example eating plan is called the Dietary Approaches to Stop Hypertension (DASH) diet. To eat this way: Eat plenty of fresh fruits and vegetables. Try to fill one-half of your plate at each meal with fruits and vegetables. Eat whole grains, such as whole-wheat pasta, brown rice, or whole-grain bread. Fill about one-fourth of your plate with whole grains. Eat low-fat dairy products. Avoid fatty cuts of meat, processed or cured meats, and poultry with skin. Fill about one-fourth of your plate with lean proteins such as fish, chicken without skin, beans, eggs, and tofu. Avoid pre-made and processed foods. These tend to be higher in sodium, added sugar, and fat. Reduce your daily sodium intake. Most people with hypertension should eat less than 1,500 mg of sodium a day. Lifestyle  Work with your health care provider to maintain a healthy body weight or to lose weight. Ask what an ideal weight is for you. Get at least 30 minutes of exercise that causes your heart to beat faster (aerobic exercise) most days of the week. Activities may include walking, swimming, or biking. Include exercise to strengthen your muscles (resistance exercise), such as weight lifting, as part of your weekly exercise routine. Try to do these types of exercises for 30 minutes at least 3 days a week. Do not use any products that contain nicotine or tobacco, such as cigarettes, e-cigarettes,   and chewing tobacco. If you need help quitting, ask your health care provider. Control any long-term (chronic) conditions you have, such as high  cholesterol or diabetes. Identify your sources of stress and find ways to manage stress. This may include meditation, deep breathing, or making time for fun activities. Alcohol use Do not drink alcohol if: Your health care provider tells you not to drink. You are pregnant, may be pregnant, or are planning to become pregnant. If you drink alcohol: Limit how much you use to: 0-1 drink a day for women. 0-2 drinks a day for men. Be aware of how much alcohol is in your drink. In the [place] one drink equals one 12 oz bottle of beer (355 mL), one 5 oz glass of wine (148 mL), or one 1 oz glass of hard liquor (44 mL). Medicines Your health care provider may prescribe medicine if lifestyle changes are not enough to get your blood pressure under control and if: Your systolic blood pressure is 130 or higher. Your diastolic blood pressure is 80 or higher. Take medicines only as told by your health care provider. Follow the directions carefully. Blood pressure medicines must be taken as told by your health care provider. The medicine does not work as well when you skip doses. Skipping doses also puts you at risk for problems. Monitoring Before you monitor your blood pressure: Do not smoke, drink caffeinated beverages, or exercise within 30 minutes before taking a measurement. Use the bathroom and empty your bladder (urinate). Sit quietly for at least 5 minutes before taking measurements. Monitor your blood pressure at home as told by your health care provider. To do this: Sit with your back straight and supported. Place your feet flat on the floor. Do not cross your legs. Support your arm on a flat surface, such as a table. Make sure your upper arm is at heart level. Each time you measure, take two or three readings one minute apart and record the results. You may also need to have your blood pressure checked regularly by your health care provider. General information Talk with your health care  provider about your diet, exercise habits, and other lifestyle factors that may be contributing to hypertension. Review all the medicines you take with your health care provider because there may be side effects or interactions. Keep all visits as told by your health care provider. Your health care provider can help you create and adjust your plan for managing your high blood pressure. Where to find more information National Heart, Lung, and Blood Institute: www.nhlbi.nih.gov American Heart Association: www.heart.org Contact a health care provider if: You think you are having a reaction to medicines you have taken. You have repeated (recurrent) headaches. You feel dizzy. You have swelling in your ankles. You have trouble with your vision. Get help right away if: You develop a severe headache or confusion. You have unusual weakness or numbness, or you feel faint. You have severe pain in your chest or abdomen. You vomit repeatedly. You have trouble breathing. These symptoms may represent a serious problem that is an emergency. Do not wait to see if the symptoms will go away. Get medical help right away. Call your local emergency services (911 in the [place] Do not drive yourself to the hospital. Summary Hypertension is when the force of blood pumping through your arteries is too strong. If this condition is not controlled, it may put you at risk for serious complications. Your personal target blood pressure may vary depending on   your medical conditions, your age, and other factors. For most people, a normal blood pressure is less than 120/80. Hypertension is managed by lifestyle changes, medicines, or both. Lifestyle changes to help manage hypertension include losing weight, eating a healthy, low-sodium diet, exercising more, stopping smoking, and limiting alcohol. This information is not intended to replace advice given to you by your health care provider. Make sure you discuss any questions  you have with your health care provider. Document Revised: 11/14/2019 Document Reviewed: 09/09/2019 Elsevier Patient Education  2022 [place].  Prediabetes Eating Plan Prediabetes is a condition that causes blood sugar (glucose) levels to be higher than normal. This increases the risk for developing type 2 diabetes (type 2 diabetes mellitus). Working with a health care provider or nutrition specialist (dietitian) to make diet and lifestyle changes can help prevent the onset of diabetes. These changes may help you: Control your blood glucose levels. Improve your cholesterol levels. Manage your blood pressure. What are tips for following this plan? Reading food labels Read food labels to check the amount of fat, salt (sodium), and sugar in prepackaged foods. Avoid foods that have: Saturated fats. Trans fats. Added sugars. Avoid foods that have more than 300 milligrams (mg) of sodium per serving. Limit your sodium intake to less than 2,300 mg each day. Shopping Avoid buying pre-made and processed foods. Avoid buying drinks with added sugar. Cooking Cook with olive oil. Do not use butter, lard, or ghee. Bake, broil, grill, steam, or boil foods. Avoid frying. Meal planning  Work with your dietitian to create an eating plan that is right for you. This may include tracking how many calories you take in each day. Use a food diary, notebook, or mobile application to track what you eat at each meal. Consider following a Mediterranean diet. This includes: Eating several servings of fresh fruits and vegetables each day. Eating fish at least twice a week. Eating one serving each day of whole grains, beans, nuts, and seeds. Using olive oil instead of other fats. Limiting alcohol. Limiting red meat. Using nonfat or low-fat dairy products. Consider following a plant-based diet. This includes dietary choices that focus on eating mostly vegetables and fruit, grains, beans, nuts, and seeds. If  you have high blood pressure, you may need to limit your sodium intake or follow a diet such as the DASH (Dietary Approaches to Stop Hypertension) eating plan. The DASH diet aims to lower high blood pressure. Lifestyle Set weight loss goals with help from your health care team. It is recommended that most people with prediabetes lose 7% of their body weight. Exercise for at least 30 minutes 5 or more days a week. Attend a support group or seek support from a mental health counselor. Take over-the-counter and prescription medicines only as told by your health care provider. What foods are recommended? Fruits Berries. Bananas. Apples. Oranges. Grapes. Papaya. Mango. Pomegranate. Kiwi. Grapefruit. Cherries. Vegetables Lettuce. Spinach. Peas. Beets. Cauliflower. Cabbage. Broccoli. Carrots. Tomatoes. Squash. Eggplant. Herbs. Peppers. Onions. Cucumbers. Brussels sprouts. Grains Whole grains, such as whole-wheat or whole-grain breads, crackers, cereals, and pasta. Unsweetened oatmeal. Bulgur. Barley. Quinoa. Brown rice. Corn or whole-wheat flour tortillas or taco shells. Meats and other proteins Seafood. Poultry without skin. Lean cuts of pork and beef. Tofu. Eggs. Nuts. Beans. Dairy Low-fat or fat-free dairy products, such as yogurt, cottage cheese, and cheese. Beverages Water. Tea. Coffee. Sugar-free or diet soda. Seltzer water. Low-fat or nonfat milk. Milk alternatives, such as soy or almond milk. Fats and oils  Olive oil. Canola oil. Sunflower oil. Grapeseed oil. Avocado. Walnuts. Sweets and desserts Sugar-free or low-fat pudding. Sugar-free or low-fat ice cream and other frozen treats. Seasonings and condiments Herbs. Sodium-free spices. Mustard. Relish. Low-salt, low-sugar ketchup. Low-salt, low-sugar barbecue sauce. Low-fat or fat-free mayonnaise. The items listed above may not be a complete list of recommended foods and beverages. Contact a dietitian for more information. What foods are not  recommended? Fruits Fruits canned with syrup. Vegetables Canned vegetables. Frozen vegetables with butter or cream sauce. Grains Refined white flour and flour products, such as bread, pasta, snack foods, and cereals. Meats and other proteins Fatty cuts of meat. Poultry with skin. Breaded or fried meat. Processed meats. Dairy Full-fat yogurt, cheese, or milk. Beverages Sweetened drinks, such as iced tea and soda. Fats and oils Butter. Lard. Ghee. Sweets and desserts Baked goods, such as cake, cupcakes, pastries, cookies, and cheesecake. Seasonings and condiments Spice mixes with added salt. Ketchup. Barbecue sauce. Mayonnaise. The items listed above may not be a complete list of foods and beverages that are not recommended. Contact a dietitian for more information. Where to find more information American Diabetes Association: www.diabetes.org Summary You may need to make diet and lifestyle changes to help prevent the onset of diabetes. These changes can help you control blood sugar, improve cholesterol levels, and manage blood pressure. Set weight loss goals with help from your health care team. It is recommended that most people with prediabetes lose 7% of their body weight. Consider following a Mediterranean diet. This includes eating plenty of fresh fruits and vegetables, whole grains, beans, nuts, seeds, fish, and low-fat dairy, and using olive oil instead of other fats. This information is not intended to replace advice given to you by your health care provider. Make sure you discuss any questions you have with your health care provider. Document Revised: 01/08/2020 Document Reviewed: 01/08/2020 Elsevier Patient Education  [place].

## 2021-08-25 ENCOUNTER — Encounter: Payer: Self-pay | Admitting: Nurse Practitioner

## 2021-08-25 DIAGNOSIS — R7303 Prediabetes: Secondary | ICD-10-CM | POA: Insufficient documentation

## 2021-09-12 ENCOUNTER — Other Ambulatory Visit: Payer: Self-pay

## 2021-09-12 ENCOUNTER — Ambulatory Visit: Payer: 59 | Admitting: Nurse Practitioner

## 2021-09-12 ENCOUNTER — Encounter (HOSPITAL_BASED_OUTPATIENT_CLINIC_OR_DEPARTMENT_OTHER): Payer: Self-pay | Admitting: Obstetrics & Gynecology

## 2021-09-12 NOTE — Progress Notes (Signed)
Spoke w/ via phone for pre-op interview--- pt Lab needs dos----  urine preg             Lab results------ pt getting lab work done 09-13-2021 CBC/ CMP/ T&S/ EKG COVID test -----patient states asymptomatic no test needed Arrive at ------- 0530 on 09-19-2021 NPO after MN NO Solid Food.  Clear liquids from MN until--- 0430 Med rec completed Medications to take morning of surgery ----- norvasc, metoprolol Diabetic medication ----- n/a Patient instructed no nail polish to be worn day of surgery Patient instructed to bring photo id and insurance card day of surgery Patient aware to have Driver (ride ) / caregiver for 24 hours after surgery --mother, Lacey Carrillo  Patient Special Instructions ----- reviewed RCC and visitor guideliines Pre-Op special Istructions ----- pt will pick-up hibiclens soap at lab appt 09-13-2021 Patient verbalized understanding of instructions that were given at this phone interview. Patient denies shortness of breath, chest pain, fever, cough at this phone interview.

## 2021-09-12 NOTE — Progress Notes (Signed)
Your procedure is scheduled on   Report to [facility] AT   5:30 AM   Call this number if you have problems the morning of surgery  :[phone].   OUR ADDRESS IS [address] [place].  WE ARE LOCATED IN [facility].  PLEASE BRING YOUR INSURANCE CARD AND PHOTO ID DAY OF SURGERY.  ONLY ONE PERSON ALLOWED IN FACILITY WAITING AREA.                                     REMEMBER: No solid food after midnight night before surgery.   YOU MAY HAVE CLEAR LIQUIDS FROM MIDNIGHT UNTIL 4:30 AM_. NO CLEAR LIQUIDS AFTER _4:30 DAY OF SURGERY. After 4:30 AM nothing by mouth including water, candy, gum, mints   YOU MAY  BRUSH YOUR TEETH MORNING OF SURGERY AND RINSE YOUR MOUTH OUT, NO CHEWING GUM CANDY OR MINTS.    CLEAR LIQUID DIET                      Coffe and tea, regular and decaf   without cream or mild products                                                             Sports drinks like Gatorade Water      TAKE THESE MEDICATIONS MORNING OF SURGERY WITH A SIP OF WATER:  _Norvasc, Metoprolol  ONE VISITOR IS ALLOWED IN WAITING ROOM ONLY DAY OF SURGERY.  YOU MAY HAVE ANOTHER PERSON SWITCH OUT WITH THE  1  VISITOR IN THE WAITING ROOM DAY OF SURGERY AND A MASK MUST BE WORN IN THE WAITING ROOM.    2 VISITORS  MAY VISIT IN THE EXTENDED RECOVERY ROOM UNTIL 800 PM ONLY 1 VISITOR AGE 2 AND OVER MAY SPEND THE NIGHT AND MUST BE IN EXTENDED RECOVERY ROOM NO LATER THAN 800 PM .   UP TO 2 CHILDREN AGE 29 TO 15 MAY ALSO VISIT IN EXTENDED RECOVERY ROOM ONLY UNTIL 800 PM AND MUST LEAVE BY 800 PM. ALL PERSONS VISITING IN EXTENDED RECOVERY ROOM MUST WEAR A MASK.                                    DO NOT WEAR JEWERLY, MAKE UP. DO NOT WEAR LOTIONS, POWDERS, PERFUMES OR NAIL POLISH ON YOUR FINGERNAILS. TOENAIL POLISH IS OK TO WEAR. DO NOT SHAVE FOR 48 HOURS PRIOR TO DAY OF SURGERY. CONTACTS, GLASSES, OR DENTURES MAY NOT BE WORN TO SURGERY.                                     [facility] IS NOT RESPONSIBLE  FOR ANY BELONGINGS.                                                                    Marland Kitchen   - Preparing for Surgery Before surgery, you can play an important role.  Because skin is not sterile, your skin needs to be as free of germs as possible.  You can reduce the number of germs on your skin by washing with CHG (chlorahexidine gluconate) soap before surgery.  CHG is an antiseptic cleaner which kills germs and bonds with the skin to continue killing germs even after washing. Please DO NOT use if you have an allergy to CHG or antibacterial soaps.  If your skin becomes reddened/irritated stop using the CHG and inform your nurse when you arrive at Short Stay. Do not shave (including legs and underarms) for at least 48 hours prior to the first CHG shower.  You may shave your face/neck. Please follow these instructions carefully:  1.  Shower with CHG Soap the night before surgery and the  morning of Surgery.  2.  If you choose to wash your hair, wash your hair first as usual with your  normal  shampoo.  3.  After you shampoo, rinse your hair and body thoroughly to remove the  shampoo.                            4.  Use CHG as you would any other liquid soap.  You can apply chg directly  to the skin and wash                      Gently with a scrungie or clean washcloth.  5.  Apply the CHG Soap to your body ONLY FROM THE NECK DOWN.   Do not use on face/ open                           Wound or open sores. Avoid contact with eyes, ears mouth and genitals (private parts).                       Wash face,  Genitals (private parts) with your normal soap.             6.  Wash thoroughly, paying special attention to the area where your surgery  will be performed.  7.  Thoroughly rinse your body with warm water from the neck down.  8.  DO NOT shower/wash with your normal soap after using and rinsing off  the CHG Soap.                9.  Pat yourself dry with  a clean towel.            10.  Wear clean pajamas.            11.  Place clean sheets on your bed the night of your first shower and do not  sleep with pets. Day of Surgery : Do not apply any lotions/deodorants the morning of surgery.  Please wear clean clothes to the hospital/surgery center.

## 2021-09-13 ENCOUNTER — Encounter (HOSPITAL_COMMUNITY)
Admission: RE | Admit: 2021-09-13 | Discharge: 2021-09-13 | Disposition: A | Discharge status: Home / Self Care | Payer: 59 | Source: Ambulatory Visit | Attending: Obstetrics & Gynecology | Admitting: Obstetrics & Gynecology

## 2021-09-13 DIAGNOSIS — Z01818 Encounter for other preprocedural examination: Secondary | ICD-10-CM | POA: Diagnosis not present

## 2021-09-13 DIAGNOSIS — D252 Subserosal leiomyoma of uterus: Secondary | ICD-10-CM

## 2021-09-13 LAB — COMPREHENSIVE METABOLIC PANEL
ALT: 14 U/L (ref 0–44)
AST: 14 U/L — ABNORMAL LOW (ref 15–41)
Albumin: 3.8 g/dL (ref 3.5–5.0)
Alkaline Phosphatase: 85 U/L (ref 38–126)
Anion gap: 9 (ref 5–15)
BUN: 12 mg/dL (ref 6–20)
CO2: 23 mmol/L (ref 22–32)
Calcium: 9.4 mg/dL (ref 8.9–10.3)
Chloride: 107 mmol/L (ref 98–111)
Creatinine, Ser: 0.63 mg/dL (ref 0.44–1.00)
GFR, Estimated: >60 mL/min (ref >60)
Glucose, Bld: 116 mg/dL — ABNORMAL HIGH (ref 70–99)
Potassium: 3.9 mmol/L (ref 3.5–5.1)
Sodium: 139 mmol/L (ref 135–145)
Total Bilirubin: 0.7 mg/dL (ref 0.3–1.2)
Total Protein: 7.5 g/dL (ref 6.5–8.1)

## 2021-09-13 LAB — CBC
HCT: 39.6 % (ref 36.0–46.0)
Hemoglobin: 12.8 g/dL (ref 12.0–15.0)
MCH: 27.8 pg (ref 26.0–34.0)
MCHC: 32.3 g/dL (ref 30.0–36.0)
MCV: 86.1 fL (ref 80.0–100.0)
Platelets: 257 10*3/uL (ref 150–400)
RBC: 4.6 MIL/uL (ref 3.87–5.11)
RDW: 12.4 % (ref 11.5–15.5)
WBC: 8.1 10*3/uL (ref 4.0–10.5)
nRBC: 0 % (ref 0.0–0.2)

## 2021-09-14 NOTE — H&P (Signed)
Lacey Carrillo is an 35 y.o. female G17 with symptomatic uterine leiomyoma.  Patient has managed fibroid symptoms expectantly but over time, bulk symptoms have become intolerable.  She experiences pelvic pressure and urinary frequency/nocturia with increasing severity.  Has Mirena IUD which has induced amenorrhea.  Last U/S 11/12/20 showed 3182 cc uterine volume with fibroids: 15.9 x 9.7 cm subserosal vs pedunculated, 8.7 cm intramural vs subserosal, 5.2 cm subserosal vs pedunculated, and 4.0 cm subserosal.  Patient wishes to have surgical management of her fibroids but maintain child-bearing potential.  Pertinent Gynecological History: Menses:  amenorrhea with Mirena Bleeding: none Contraception: IUD DES exposure: unknown Blood transfusions: none Sexually transmitted diseases: no past history Previous GYN Procedures:  none   Last mammogram:  n/a  Date: n/a Last pap: normal Date: 2019 OB History: G0  Menstrual History: Menarche age: n/a No LMP recorded. (Menstrual status: IUD).    Past Medical History:  Diagnosis Date   Heart palpitations    per pt followed by pcp, no work-up other than ekg done ,  approx 2014,  (09-12-2021 pt stated controlled w/ metoprolol )   History of COVID-19 2020   per pt mild symptoms that resolved   Hypertension    Uterine fibroid    Wears glasses     Past Surgical History:  Procedure Laterality Date   NO PAST SURGERIES     WISDOM TOOTH EXTRACTION      Family History  Problem Relation Age of Onset   Diabetes Mother    Diabetes Father     Social History:  reports that she has never smoked. She has never used smokeless tobacco. She reports that she does not currently use alcohol. She reports that she does not use drugs.  Allergies: No Known Allergies  No medications prior to admission.    Review of Systems  Height 5' (1.524 m), weight 78 kg. Physical Exam Constitutional:      Appearance: Normal appearance.  HENT:     Head: Normocephalic and  atraumatic.  Pulmonary:     Effort: Pulmonary effort is normal.  Abdominal:     Palpations: Abdomen is soft.  Musculoskeletal:        General: Normal range of motion.     Cervical back: Normal range of motion.  Skin:    General: Skin is warm and dry.  Neurological:     Mental Status: She is alert and oriented to person, place, and time.  Psychiatric:        Mood and Affect: Mood normal.        Behavior: Behavior normal.    Results for orders placed or performed during the hospital encounter of 09/13/21 (from the past 24 hour(s))  Type and screen [facility]     Status: None   Collection Time: 09/13/21 11:50 AM  Result Value Ref Range   ABO/RH(D) B POS    Antibody Screen NEG    Sample Expiration 09/27/2021,2359    Extend sample reason      NO TRANSFUSIONS OR PREGNANCY IN THE PAST 3 MONTHS Performed at [facility], [place] [address] [place], [place] [address]   CBC     Status: None   Collection Time: 09/13/21 11:50 AM  Result Value Ref Range   WBC 8.1 4.0 - 10.5 K/uL   RBC 4.60 3.87 - 5.11 MIL/uL   Hemoglobin 12.8 12.0 - 15.0 g/dL   HCT 39.6 36.0 - 46.0 %   MCV 86.1 80.0 - 100.0 fL  MCH 27.8 26.0 - 34.0 pg   MCHC 32.3 30.0 - 36.0 g/dL   RDW 12.4 11.5 - 15.5 %   Platelets 257 150 - 400 K/uL   nRBC 0.0 0.0 - 0.2 %  Comprehensive metabolic panel     Status: Abnormal   Collection Time: 09/13/21 11:50 AM  Result Value Ref Range   Sodium 139 135 - 145 mmol/L   Potassium 3.9 3.5 - 5.1 mmol/L   Chloride 107 98 - 111 mmol/L   CO2 23 22 - 32 mmol/L   Glucose, Bld 116 (H) 70 - 99 mg/dL   BUN 12 6 - 20 mg/dL   Creatinine, Ser 0.63 0.44 - 1.00 mg/dL   Calcium 9.4 8.9 - 10.3 mg/dL   Total Protein 7.5 6.5 - 8.1 g/dL   Albumin 3.8 3.5 - 5.0 g/dL   AST 14 (L) 15 - 41 U/L   ALT 14 0 - 44 U/L   Alkaline Phosphatase 85 38 - 126 U/L   Total Bilirubin 0.7 0.3 - 1.2 mg/dL   GFR, Estimated >60 >60 mL/min   Anion gap 9 5 - 15    No results  found.  Assessment/Plan: 35yo with symptomatic uterine leiomyomata -Planning abdominal myomectomy -Patient is counseled re: myomectomy including risk of bleeding, infection, scarring and damage to surrounding structures.  She is informed of possibility of new growth of fibroids after myomectomy.  She is offered hormonal tx to reduce size of fibroids which is considered a temporary tx and she declines.   She is counseled that I hope to keep her uterus and well as IUD in place but this may not be possible.  She is informed of steps of the procedure as well as postop expectations.  She is also counseled re: implications in future pregnancies including need for C/S at 36-37 weeks and increased risk of uterine rupture and abnormal placentation.  All questions were answered and patient wishes to proceed.  Linda Hedges 09/14/2021, 6:03 AM

## 2021-09-18 NOTE — Anesthesia Preprocedure Evaluation (Addendum)
Anesthesia Evaluation  Patient identified by MRN, date of birth, ID band Patient awake    Reviewed: Allergy & Precautions, NPO status , Patient's Chart, lab work & pertinent test results  Airway Mallampati: II  TM Distance: >3 FB Neck ROM: Full    Dental no notable dental hx.    Pulmonary neg pulmonary ROS,    Pulmonary exam normal breath sounds clear to auscultation       Cardiovascular hypertension, Pt. on medications Normal cardiovascular exam Rhythm:Regular Rate:Normal     Neuro/Psych negative neurological ROS  negative psych ROS   GI/Hepatic negative GI ROS, Neg liver ROS,   Endo/Other  negative endocrine ROS  Renal/GU negative Renal ROS  Female GU complaint     Musculoskeletal negative musculoskeletal ROS (+)   Abdominal   Peds  Hematology negative hematology ROS (+)   Anesthesia Other Findings   Reproductive/Obstetrics negative OB ROS                            Anesthesia Physical Anesthesia Plan  ASA: 1  Anesthesia Plan: General   Post-op Pain Management: Tylenol PO (pre-op), Toradol IV (intra-op), Dilaudid IV and Ketamine IV   Induction: Intravenous  PONV Risk Score and Plan: 4 or greater and Ondansetron, Dexamethasone, Midazolam, Scopolamine patch - Pre-op and Treatment may vary due to age or medical condition  Airway Management Planned: Oral ETT  Additional Equipment: None  Intra-op Plan:   Post-operative Plan: Extubation in OR  Informed Consent: I have reviewed the patients History and Physical, chart, labs and discussed the procedure including the risks, benefits and alternatives for the proposed anesthesia with the patient or authorized representative who has indicated his/her understanding and acceptance.     Dental advisory given  Plan Discussed with: CRNA  Anesthesia Plan Comments:        Anesthesia Quick Evaluation

## 2021-09-19 ENCOUNTER — Observation Stay (HOSPITAL_BASED_OUTPATIENT_CLINIC_OR_DEPARTMENT_OTHER)
Admission: RE | Admit: 2021-09-19 | Discharge: 2021-09-20 | Disposition: A | Discharge status: Home / Self Care | Payer: 59 | Attending: Obstetrics & Gynecology | Admitting: Obstetrics & Gynecology

## 2021-09-19 ENCOUNTER — Encounter (HOSPITAL_BASED_OUTPATIENT_CLINIC_OR_DEPARTMENT_OTHER)
Admission: RE | Disposition: A | Discharge status: Home / Self Care | Payer: Self-pay | Source: Home / Self Care | Attending: Obstetrics & Gynecology

## 2021-09-19 ENCOUNTER — Encounter (HOSPITAL_BASED_OUTPATIENT_CLINIC_OR_DEPARTMENT_OTHER): Payer: Self-pay | Admitting: Obstetrics & Gynecology

## 2021-09-19 ENCOUNTER — Observation Stay (HOSPITAL_BASED_OUTPATIENT_CLINIC_OR_DEPARTMENT_OTHER): Payer: 59 | Admitting: Anesthesiology

## 2021-09-19 DIAGNOSIS — D252 Subserosal leiomyoma of uterus: Secondary | ICD-10-CM

## 2021-09-19 DIAGNOSIS — I10 Essential (primary) hypertension: Secondary | ICD-10-CM | POA: Diagnosis not present

## 2021-09-19 DIAGNOSIS — Z8616 Personal history of COVID-19: Secondary | ICD-10-CM | POA: Diagnosis not present

## 2021-09-19 DIAGNOSIS — D259 Leiomyoma of uterus, unspecified: Secondary | ICD-10-CM | POA: Diagnosis present

## 2021-09-19 DIAGNOSIS — D251 Intramural leiomyoma of uterus: Secondary | ICD-10-CM

## 2021-09-19 HISTORY — PX: MYOMECTOMY: SHX85

## 2021-09-19 HISTORY — DX: Presence of spectacles and contact lenses: Z97.3

## 2021-09-19 HISTORY — DX: Leiomyoma of uterus, unspecified: D25.9

## 2021-09-19 HISTORY — DX: Palpitations: R00.2

## 2021-09-19 LAB — ABO/RH: ABO/RH(D): B POS

## 2021-09-19 LAB — TYPE AND SCREEN
ABO/RH(D): B POS
Antibody Screen: NEGATIVE

## 2021-09-19 LAB — POCT PREGNANCY, URINE: Preg Test, Ur: NEGATIVE

## 2021-09-19 SURGERY — MYOMECTOMY, ABDOMINAL APPROACH
Anesthesia: General | Site: Abdomen

## 2021-09-19 MED ORDER — ONDANSETRON HCL 4 MG/2ML IJ SOLN: 4.0000 mg | Freq: Four times a day (QID) | INTRAMUSCULAR | Status: DC | PRN

## 2021-09-19 MED ORDER — ACETAMINOPHEN 500 MG PO TABS
1000.0000 mg | ORAL_TABLET | Freq: Once | ORAL | Status: AC
  Administered 2021-09-19: 06:00:00 1000 mg via ORAL

## 2021-09-19 MED ORDER — HYDROMORPHONE HCL 1 MG/ML IJ SOLN
INTRAMUSCULAR | Status: AC
  Filled 2021-09-19: qty 1

## 2021-09-19 MED ORDER — PROMETHAZINE HCL 25 MG/ML IJ SOLN: 6.2500 mg | INTRAMUSCULAR | Status: DC | PRN

## 2021-09-19 MED ORDER — LIDOCAINE 2% (20 MG/ML) 5 ML SYRINGE
INTRAMUSCULAR | Status: DC | PRN
  Administered 2021-09-19: 60 mg via INTRAVENOUS

## 2021-09-19 MED ORDER — MIDAZOLAM HCL 2 MG/2ML IJ SOLN
INTRAMUSCULAR | Status: AC
  Filled 2021-09-19: qty 2

## 2021-09-19 MED ORDER — ONDANSETRON HCL 4 MG PO TABS: 4.0000 mg | ORAL_TABLET | Freq: Four times a day (QID) | ORAL | Status: DC | PRN

## 2021-09-19 MED ORDER — ONDANSETRON HCL 4 MG/2ML IJ SOLN
INTRAMUSCULAR | Status: AC
  Filled 2021-09-19: qty 2

## 2021-09-19 MED ORDER — OXYCODONE HCL 5 MG PO TABS
5.0000 mg | ORAL_TABLET | ORAL | Status: DC | PRN
  Administered 2021-09-19: 10:00:00 5 mg via ORAL
  Administered 2021-09-19: 23:00:00 10 mg via ORAL
  Administered 2021-09-19 (×2): 5 mg via ORAL
  Administered 2021-09-20 (×2): 10 mg via ORAL

## 2021-09-19 MED ORDER — DEXAMETHASONE SODIUM PHOSPHATE 10 MG/ML IJ SOLN
INTRAMUSCULAR | Status: AC
  Filled 2021-09-19: qty 1

## 2021-09-19 MED ORDER — MIDAZOLAM HCL 2 MG/2ML IJ SOLN
INTRAMUSCULAR | Status: DC | PRN
  Administered 2021-09-19: 2 mg via INTRAVENOUS

## 2021-09-19 MED ORDER — KETAMINE HCL 50 MG/5ML IJ SOSY
PREFILLED_SYRINGE | INTRAMUSCULAR | Status: AC
  Filled 2021-09-19: qty 5

## 2021-09-19 MED ORDER — PROPOFOL 500 MG/50ML IV EMUL
INTRAVENOUS | Status: AC
  Filled 2021-09-19: qty 50

## 2021-09-19 MED ORDER — KETOROLAC TROMETHAMINE 30 MG/ML IJ SOLN
INTRAMUSCULAR | Status: AC
  Filled 2021-09-19: qty 1

## 2021-09-19 MED ORDER — SUGAMMADEX SODIUM 200 MG/2ML IV SOLN
INTRAVENOUS | Status: DC | PRN
Start: 2021-09-19 — End: 2021-09-19
  Administered 2021-09-19: 200 mg via INTRAVENOUS

## 2021-09-19 MED ORDER — ROCURONIUM BROMIDE 10 MG/ML (PF) SYRINGE
PREFILLED_SYRINGE | INTRAVENOUS | Status: DC | PRN
  Administered 2021-09-19: 80 mg via INTRAVENOUS

## 2021-09-19 MED ORDER — POVIDONE-IODINE 10 % EX SWAB: 2.0000 "application " | Freq: Once | CUTANEOUS | Status: DC

## 2021-09-19 MED ORDER — CEFAZOLIN SODIUM-DEXTROSE 2-4 GM/100ML-% IV SOLN
INTRAVENOUS | Status: AC
  Filled 2021-09-19: qty 100

## 2021-09-19 MED ORDER — CEFAZOLIN SODIUM-DEXTROSE 2-4 GM/100ML-% IV SOLN
2.0000 g | INTRAVENOUS | Status: AC
  Administered 2021-09-19: 08:00:00 2 g via INTRAVENOUS

## 2021-09-19 MED ORDER — DOCUSATE SODIUM 100 MG PO CAPS
ORAL_CAPSULE | ORAL | Status: AC
  Filled 2021-09-19: qty 1

## 2021-09-19 MED ORDER — PROPOFOL 10 MG/ML IV BOLUS
INTRAVENOUS | Status: DC | PRN
  Administered 2021-09-19: 150 mg via INTRAVENOUS

## 2021-09-19 MED ORDER — DOCUSATE SODIUM 100 MG PO CAPS
100.0000 mg | ORAL_CAPSULE | Freq: Two times a day (BID) | ORAL | Status: DC
  Administered 2021-09-19 (×2): 100 mg via ORAL

## 2021-09-19 MED ORDER — OXYCODONE HCL 5 MG PO TABS
ORAL_TABLET | ORAL | Status: AC
  Filled 2021-09-19: qty 2

## 2021-09-19 MED ORDER — METOPROLOL SUCCINATE ER 100 MG PO TB24: 100.0000 mg | ORAL_TABLET | Freq: Every day | ORAL | Status: DC

## 2021-09-19 MED ORDER — ACETAMINOPHEN 500 MG PO TABS
ORAL_TABLET | ORAL | Status: AC
  Filled 2021-09-19: qty 2

## 2021-09-19 MED ORDER — FENTANYL CITRATE (PF) 250 MCG/5ML IJ SOLN
INTRAMUSCULAR | Status: AC
  Filled 2021-09-19: qty 5

## 2021-09-19 MED ORDER — SENNOSIDES-DOCUSATE SODIUM 8.6-50 MG PO TABS: 1.0000 | ORAL_TABLET | Freq: Every evening | ORAL | Status: DC | PRN

## 2021-09-19 MED ORDER — KETOROLAC TROMETHAMINE 30 MG/ML IJ SOLN
INTRAMUSCULAR | Status: DC | PRN
  Administered 2021-09-19: 30 mg via INTRAVENOUS

## 2021-09-19 MED ORDER — LACTATED RINGERS IV SOLN: INTRAVENOUS | Status: DC

## 2021-09-19 MED ORDER — OXYCODONE HCL 5 MG/5ML PO SOLN: 5.0000 mg | Freq: Once | ORAL | Status: DC | PRN

## 2021-09-19 MED ORDER — MEPERIDINE HCL 25 MG/ML IJ SOLN: 6.2500 mg | INTRAMUSCULAR | Status: DC | PRN

## 2021-09-19 MED ORDER — KETOROLAC TROMETHAMINE 30 MG/ML IJ SOLN: 30.0000 mg | Freq: Once | INTRAMUSCULAR | Status: DC | PRN

## 2021-09-19 MED ORDER — ROCURONIUM BROMIDE 10 MG/ML (PF) SYRINGE
PREFILLED_SYRINGE | INTRAVENOUS | Status: AC
  Filled 2021-09-19: qty 10

## 2021-09-19 MED ORDER — OXYCODONE HCL 5 MG PO TABS: 5.0000 mg | ORAL_TABLET | Freq: Once | ORAL | Status: DC | PRN

## 2021-09-19 MED ORDER — OXYCODONE HCL 5 MG PO TABS
ORAL_TABLET | ORAL | Status: AC
  Filled 2021-09-19: qty 1

## 2021-09-19 MED ORDER — DEXAMETHASONE SODIUM PHOSPHATE 10 MG/ML IJ SOLN
INTRAMUSCULAR | Status: DC | PRN
  Administered 2021-09-19: 10 mg via INTRAVENOUS

## 2021-09-19 MED ORDER — MENTHOL 3 MG MT LOZG: 1.0000 | LOZENGE | OROMUCOSAL | Status: DC | PRN

## 2021-09-19 MED ORDER — HYDROMORPHONE HCL 1 MG/ML IJ SOLN
0.2500 mg | INTRAMUSCULAR | Status: DC | PRN
  Administered 2021-09-19: 09:00:00 0.5 mg via INTRAVENOUS
  Administered 2021-09-19 (×4): 0.25 mg via INTRAVENOUS

## 2021-09-19 MED ORDER — LIDOCAINE 2% (20 MG/ML) 5 ML SYRINGE
INTRAMUSCULAR | Status: AC
  Filled 2021-09-19: qty 5

## 2021-09-19 MED ORDER — FENTANYL CITRATE (PF) 100 MCG/2ML IJ SOLN
INTRAMUSCULAR | Status: DC | PRN
  Administered 2021-09-19 (×2): 50 ug via INTRAVENOUS
  Administered 2021-09-19: 100 ug via INTRAVENOUS
  Administered 2021-09-19: 50 ug via INTRAVENOUS

## 2021-09-19 MED ORDER — ACETAMINOPHEN 500 MG PO TABS
1000.0000 mg | ORAL_TABLET | Freq: Four times a day (QID) | ORAL | Status: DC
  Administered 2021-09-19 – 2021-09-20 (×4): 1000 mg via ORAL

## 2021-09-19 MED ORDER — SCOPOLAMINE 1 MG/3DAYS TD PT72
1.0000 | MEDICATED_PATCH | TRANSDERMAL | Status: DC
  Administered 2021-09-19: 06:00:00 1.5 mg via TRANSDERMAL

## 2021-09-19 MED ORDER — KETOROLAC TROMETHAMINE 30 MG/ML IJ SOLN
30.0000 mg | Freq: Four times a day (QID) | INTRAMUSCULAR | Status: DC
  Administered 2021-09-19 – 2021-09-20 (×3): 30 mg via INTRAVENOUS

## 2021-09-19 MED ORDER — SCOPOLAMINE 1 MG/3DAYS TD PT72
MEDICATED_PATCH | TRANSDERMAL | Status: AC
  Filled 2021-09-19: qty 1

## 2021-09-19 MED ORDER — HYDROMORPHONE HCL 1 MG/ML IJ SOLN: 0.2000 mg | INTRAMUSCULAR | Status: DC | PRN

## 2021-09-19 MED ORDER — 0.9 % SODIUM CHLORIDE (POUR BTL) OPTIME
TOPICAL | Status: DC | PRN
  Administered 2021-09-19: 08:00:00 2000 mL

## 2021-09-19 MED ORDER — ONDANSETRON HCL 4 MG/2ML IJ SOLN
INTRAMUSCULAR | Status: DC | PRN
  Administered 2021-09-19: 4 mg via INTRAVENOUS

## 2021-09-19 MED ORDER — SIMETHICONE 80 MG PO CHEW: 80.0000 mg | CHEWABLE_TABLET | Freq: Four times a day (QID) | ORAL | Status: DC | PRN

## 2021-09-19 MED ORDER — KETAMINE HCL-SODIUM CHLORIDE 100-0.9 MG/10ML-% IV SOSY
PREFILLED_SYRINGE | INTRAVENOUS | Status: DC | PRN
  Administered 2021-09-19: 35 mg via INTRAVENOUS

## 2021-09-19 MED ORDER — PHENYLEPHRINE 40 MCG/ML (10ML) SYRINGE FOR IV PUSH (FOR BLOOD PRESSURE SUPPORT)
PREFILLED_SYRINGE | INTRAVENOUS | Status: DC | PRN
  Administered 2021-09-19: 80 ug via INTRAVENOUS

## 2021-09-19 MED ORDER — MENTHOL 3 MG MT LOZG
LOZENGE | OROMUCOSAL | Status: AC
  Filled 2021-09-19: qty 9

## 2021-09-19 SURGICAL SUPPLY — 45 items
APL SKNCLS STERI-STRIP NONHPOA (GAUZE/BANDAGES/DRESSINGS) ×1
BARRIER ADHS 3X4 INTERCEED (GAUZE/BANDAGES/DRESSINGS) ×4 IMPLANT
BENZOIN TINCTURE PRP APPL 2/3 (GAUZE/BANDAGES/DRESSINGS) ×2 IMPLANT
BLADE CLIPPER SENSICLIP SURGIC (BLADE) IMPLANT
BRR ADH 4X3 ABS CNTRL BYND (GAUZE/BANDAGES/DRESSINGS) ×2
DECANTER SPIKE VIAL GLASS SM (MISCELLANEOUS) IMPLANT
DRAPE CESAREAN BIRTH W POUCH (DRAPES) ×2 IMPLANT
DRAPE UTILITY XL STRL (DRAPES) ×2 IMPLANT
DRAPE WARM FLUID 44X44 (DRAPES) ×2 IMPLANT
DRSG OPSITE POSTOP 4X10 (GAUZE/BANDAGES/DRESSINGS) ×2 IMPLANT
DRSG TELFA 3X8 NADH (GAUZE/BANDAGES/DRESSINGS) ×2 IMPLANT
DURAPREP 26ML APPLICATOR (WOUND CARE) ×2 IMPLANT
ELECT REM PT RETURN 9FT ADLT (ELECTROSURGICAL) ×2
ELECTRODE REM PT RTRN 9FT ADLT (ELECTROSURGICAL) ×1 IMPLANT
GAUZE 4X4 16PLY ~~LOC~~+RFID DBL (SPONGE) ×2 IMPLANT
GLOVE SURG POLYISO LF SZ5.5 (GLOVE) ×2 IMPLANT
GLOVE SURG UNDER POLY LF SZ6 (GLOVE) ×2 IMPLANT
GOWN STRL REUS W/TWL LRG LVL3 (GOWN DISPOSABLE) ×2 IMPLANT
HEMOSTAT SURGICEL 4X8 (HEMOSTASIS) IMPLANT
HOLDER FOLEY CATH W/STRAP (MISCELLANEOUS) IMPLANT
KIT TURNOVER CYSTO (KITS) ×2 IMPLANT
NS IRRIG 1000ML POUR BTL (IV SOLUTION) ×4 IMPLANT
NS IRRIG 500ML POUR BTL (IV SOLUTION) IMPLANT
PACK ABDOMINAL GYN (CUSTOM PROCEDURE TRAY) ×2 IMPLANT
PAD OB MATERNITY 4.3X12.25 (PERSONAL CARE ITEMS) ×2 IMPLANT
SPONGE T-LAP 18X18 ~~LOC~~+RFID (SPONGE) ×2 IMPLANT
STRIP CLOSURE SKIN 1/2X4 (GAUZE/BANDAGES/DRESSINGS) ×2 IMPLANT
SUT MNCRL 0 MO-4 VIOLET 18 CR (SUTURE) ×3 IMPLANT
SUT MNCRL 0 VIOLET 6X18 (SUTURE) IMPLANT
SUT MNCRL+ AB 3-0 CT1 36 (SUTURE) ×1 IMPLANT
SUT MON AB 2-0 CT1 36 (SUTURE) IMPLANT
SUT MON AB-0 CT1 36 (SUTURE) ×2 IMPLANT
SUT MONOCRYL 0 6X18 (SUTURE)
SUT MONOCRYL 0 MO 4 18  CR/8 (SUTURE) ×3
SUT MONOCRYL AB 3-0 CT1 36IN (SUTURE) ×1
SUT PDS AB 0 CT1 27 (SUTURE) IMPLANT
SUT PLAIN 2 0 XLH (SUTURE) IMPLANT
SUT VIC AB 0 CT1 18XCR BRD 8 (SUTURE) IMPLANT
SUT VIC AB 0 CT1 36 (SUTURE) ×2 IMPLANT
SUT VIC AB 0 CT1 8-18 (SUTURE)
SUT VIC AB 2-0 CT1 (SUTURE) IMPLANT
SUT VIC AB 4-0 KS 27 (SUTURE) ×2 IMPLANT
TOWEL OR 17X26 10 PK STRL BLUE (TOWEL DISPOSABLE) ×2 IMPLANT
TRAY FOLEY W/BAG SLVR 14FR LF (SET/KITS/TRAYS/PACK) ×2 IMPLANT
WATER STERILE IRR 500ML POUR (IV SOLUTION) IMPLANT

## 2021-09-19 NOTE — Anesthesia Procedure Notes (Signed)
Procedure Name: Intubation Date/Time: 09/19/2021 7:29 AM Performed by: Genelle Bal, CRNA Pre-anesthesia Checklist: Patient identified, Emergency Drugs available, Suction available and Patient being monitored Patient Re-evaluated:Patient Re-evaluated prior to induction Oxygen Delivery Method: Circle system utilized Preoxygenation: Pre-oxygenation with 100% oxygen Induction Type: IV induction Ventilation: Mask ventilation without difficulty Laryngoscope Size: Miller and 2 Grade View: Grade I Tube type: Oral Tube size: 7.0 mm Number of attempts: 1 Airway Equipment and Method: Stylet and Oral airway Placement Confirmation: ETT inserted through vocal cords under direct vision, positive ETCO2 and breath sounds checked- equal and bilateral Secured at: 21 cm Tube secured with: Tape Dental Injury: Teeth and Oropharynx as per pre-operative assessment

## 2021-09-19 NOTE — Progress Notes (Signed)
-  NOS- No current c/o.  Pain is well controlled with po meds.  No N/V.  Tolerating diet.  No CP/SOB.  Foley in.  Vitals:   09/19/21 1045 09/19/21 1145  BP: 112/86 115/84  Pulse: 81 96  Resp: 14 16  Temp: 97.9 F (36.6 C) 97.9 F (36.6 C)  SpO2: 100% 99%   UOP clear and adequate  Gen: A&O x 3 Abd: soft, ND.  Honeycomb in place with small amount of blood stain Ext: no c/c/e  POD#0 s/p abdominal myomectomy -Ambulate -Continue pain control -Patient requests abdominal binder which is ordered -AM labs -D/C foley and IVF in AM -Likely d/c home in AM  Linda Hedges, DO

## 2021-09-19 NOTE — Progress Notes (Signed)
No change to H&P.  Jaquavian Firkus, DO 

## 2021-09-19 NOTE — Op Note (Signed)
Lacey Carrillo PROCEDURE DATE: 09/19/2021  PREOPERATIVE DIAGNOSIS:  Symptomatic fibroids POSTOPERATIVE DIAGNOSIS:  Symptomatic fibroids SURGEON:   Dr. Linda Hedges ASSISTANT:   Dr. Lucillie Garfinkel OPERATION:  Abdominal myomectomy ANESTHESIA:  General endotracheal.  INDICATIONS: The patient is a 35 y.o. G0 with history of symptomatic uterine fibroids. The patient made a decision to undergo surgical treatment with preservation of child-bearing potential. On the preoperative visit, the risks, benefits, indications, and alternatives of the procedure were reviewed with the patient.  On the day of surgery, the risks of surgery were again discussed with the patient including but not limited to: bleeding which may require transfusion or reoperation; infection which may require antibiotics; injury to bowel, bladder, ureters or other surrounding organs; need for additional procedures; thromboembolic phenomenon, incisional problems and other postoperative/anesthesia complications. Written informed consent was obtained.    OPERATIVE FINDINGS: A 28 week size uterus with multiple fibroids, normal tubes and ovaries bilaterally.  ESTIMATED BLOOD LOSS: 250 ml SPECIMENS:  Leiomyomata sent to pathology (total weight 8657 g) COMPLICATIONS:  None immediate.   DESCRIPTION OF PROCEDURE: The patient received intravenous antibiotics and had sequential compression devices applied to her lower extremities while in the preoperative area.   She was taken to the operating room and placed under general anesthesia without difficulty and found to be adequate.The abdomen and perineum were prepped and draped in a sterile manner, and a Foley catheter was inserted into the bladder and attached to constant drainage. After an adequate timeout was performed, a Pfannensteil skin incision was made. This incision was taken down to the fascia using electrocautery with care given to maintain good hemostasis. The fascia was incised in the midline  and the fascial incision was then extended bilaterally using electrocautery without difficulty. The fascia was then dissected off the underlying rectus muscles using blunt and sharp dissection. The rectus muscles were split bluntly in the midline and the peritoneum entered sharply without complication. This peritoneal incision was then extended superiorly and inferiorly with care given to prevent bowel or bladder injury. Upon entry into the abdominal cavity, the upper abdomen was inspected and found to be normal. Attention was then turned to the pelvis. Large fundal, pedunculated fibroid was noted and too large to exteriorize.  This fibroid was elevated to the incision using a tenaculum and was morcellated using the knife while being mindful of surrounding anatomy.  Once debulked, the remainder of the fibroid was exteriorized and removed from the fundus using the Bovie knife. No entry to uterine cavity was noted.  The serosal incision was closed using figure of eight 0 monocryl stitches. Another smaller pedunculated anterior fundal fibroid was removed again with the Bovie knife.  Serosal incision was repaired in the same fashion as the first.  A large intramural fibroid was noted again at the fundus.  Serosal incision was made using the Bovie knife.  Using blunt and sharp dissection, the fibroid was dissected out and clear plane was established.  Again, this fibroid was morcellated to allow complete removal.  Once the fibroid was removed, 3 layers of 0 monocryl figure of eight stitches were placed from deep to superficial which rendered the incision hemostatic.  A small anterior subserosal fibroid was removed using Bovie knife and the incision was closed with a single figure of eight monocryl stitch.  The pelvis was irrigated and hemostasis was reconfirmed.  No injury to bilateral fallopian tubes was confirmed.  Intercede x 2 pieces were placed on uterine incisions.  All laparotomy sponges and instruments  were  removed from the abdomen. The peritoneum was closed with 2-0 monocryl, and the fascia was closed with 0 Vicryl in a running fashion. The subcutaneous layer was irrigated and a few small oozing vessels were cauterized using the Bovie.  The skin was closed with a 4-0 Vicryl subcuticular stitch. Sponge, lap, needle, and instrument counts were correct times two. The patient was taken to the recovery area awake, extubated and in stable condition.

## 2021-09-19 NOTE — Transfer of Care (Signed)
Immediate Anesthesia Transfer of Care Note  Patient: Lacey Carrillo  Procedure(s) Performed: ABDOMINAL MYOMECTOMY (Abdomen)  Patient Location: PACU  Anesthesia Type:General  Level of Consciousness: awake, alert  and oriented  Airway & Oxygen Therapy: Patient Spontanous Breathing and Patient connected to face mask oxygen  Post-op Assessment: Report given to RN and Post -op Vital signs reviewed and stable  Post vital signs: Reviewed and stable  Last Vitals:  Vitals Value Taken Time  BP 130/92 09/19/21 0908  Temp    Pulse 104 09/19/21 0909  Resp 24 09/19/21 0909  SpO2 100 % 09/19/21 0909  Vitals shown include unvalidated device data.  Last Pain:  Vitals:   09/19/21 0610  TempSrc: Oral  PainSc: 0-No pain      Patients Stated Pain Goal: 6 (82/70/78 6754)  Complications: No notable events documented.

## 2021-09-19 NOTE — Anesthesia Postprocedure Evaluation (Signed)
Anesthesia Post Note  Patient: Lacey Carrillo  Procedure(s) Performed: ABDOMINAL MYOMECTOMY (Abdomen)     Patient location during evaluation: PACU Anesthesia Type: General Level of consciousness: awake and alert, oriented and patient cooperative Pain management: pain level controlled Vital Signs Assessment: post-procedure vital signs reviewed and stable Respiratory status: spontaneous breathing, nonlabored ventilation and respiratory function stable Cardiovascular status: blood pressure returned to baseline and stable Postop Assessment: no apparent nausea or vomiting Anesthetic complications: no   No notable events documented.  Last Vitals:  Vitals:   09/19/21 0945 09/19/21 1000  BP: (!) 126/91 118/85  Pulse: 89 84  Resp: 12 14  Temp:  37.1 C  SpO2: 98% 99%    Last Pain:  Vitals:   09/19/21 1000  TempSrc:   PainSc: [place]

## 2021-09-20 ENCOUNTER — Encounter (HOSPITAL_BASED_OUTPATIENT_CLINIC_OR_DEPARTMENT_OTHER): Payer: Self-pay | Admitting: Obstetrics & Gynecology

## 2021-09-20 DIAGNOSIS — D259 Leiomyoma of uterus, unspecified: Secondary | ICD-10-CM | POA: Diagnosis not present

## 2021-09-20 LAB — COMPREHENSIVE METABOLIC PANEL
ALT: 14 U/L (ref 0–44)
AST: 15 U/L (ref 15–41)
Albumin: 2.9 g/dL — ABNORMAL LOW (ref 3.5–5.0)
Alkaline Phosphatase: 58 U/L (ref 38–126)
Anion gap: 8 (ref 5–15)
BUN: 12 mg/dL (ref 6–20)
CO2: 22 mmol/L (ref 22–32)
Calcium: 8.7 mg/dL — ABNORMAL LOW (ref 8.9–10.3)
Chloride: 107 mmol/L (ref 98–111)
Creatinine, Ser: 0.79 mg/dL (ref 0.44–1.00)
GFR, Estimated: >60 mL/min (ref >60)
Glucose, Bld: 153 mg/dL — ABNORMAL HIGH (ref 70–99)
Potassium: 3.8 mmol/L (ref 3.5–5.1)
Sodium: 137 mmol/L (ref 135–145)
Total Bilirubin: 0.7 mg/dL (ref 0.3–1.2)
Total Protein: 6.2 g/dL — ABNORMAL LOW (ref 6.5–8.1)

## 2021-09-20 LAB — CBC
HCT: 32.3 % — ABNORMAL LOW (ref 36.0–46.0)
Hemoglobin: 10.2 g/dL — ABNORMAL LOW (ref 12.0–15.0)
MCH: 27.9 pg (ref 26.0–34.0)
MCHC: 31.6 g/dL (ref 30.0–36.0)
MCV: 88.3 fL (ref 80.0–100.0)
Platelets: 218 10*3/uL (ref 150–400)
RBC: 3.66 MIL/uL — ABNORMAL LOW (ref 3.87–5.11)
RDW: 12.4 % (ref 11.5–15.5)
WBC: 15.4 10*3/uL — ABNORMAL HIGH (ref 4.0–10.5)
nRBC: 0 % (ref 0.0–0.2)

## 2021-09-20 LAB — SURGICAL PATHOLOGY

## 2021-09-20 MED ORDER — OXYCODONE HCL 5 MG PO TABS
5.0000 mg | ORAL_TABLET | ORAL | 0 refills | Status: DC | PRN
Start: 2021-09-20 — End: 2022-10-18

## 2021-09-20 MED ORDER — OXYCODONE HCL 5 MG PO TABS
ORAL_TABLET | ORAL | Status: AC
  Filled 2021-09-20: qty 2

## 2021-09-20 MED ORDER — IBUPROFEN 800 MG PO TABS: 800.0000 mg | ORAL_TABLET | Freq: Three times a day (TID) | ORAL | 0 refills | Status: DC

## 2021-09-20 MED ORDER — ACETAMINOPHEN 500 MG PO TABS
ORAL_TABLET | ORAL | Status: AC
  Filled 2021-09-20: qty 2

## 2021-09-20 MED ORDER — KETOROLAC TROMETHAMINE 30 MG/ML IJ SOLN
INTRAMUSCULAR | Status: AC
  Filled 2021-09-20: qty 1

## 2021-09-20 NOTE — Discharge Instructions (Signed)
Call MD for T>100.4, heavy vaginal bleeding, severe abdominal pain, intractable nausea and/or vomiting, or respiratory distress.  Follow up at 2 week postop visit.  Pelvic rest x 4 weeks.  No driving while taking narcotics.  No heavy lifting x 4 weeks.

## 2021-09-20 NOTE — Discharge Summary (Signed)
Physician Discharge Summary  Patient ID: Lacey Carrillo MRN: [id number] DOB/AGE: 35/12/1985 35 y.o.  Admit date: 09/19/2021 Discharge date: 09/20/2021  Admission Diagnoses: Symptomatic uterine leiomyomata  Discharge Diagnoses:  Principal Problem:   Fibroid uterus   Discharged Condition: good  Hospital Course: Patient was admitted for anticipated abdominal myomectomy to treat symptomatic uterine leiomyomata.  She underwent the anticipated procedure without complication.  Please see operative note for details.  Postoperatively, patient progressed well and on POD#1, she was meeting postop goals.  Patient was ambulating, tolerating po, had adequate pain control and voiding without difficulty.  She was passing flatus and overall feeling well.  She was discharged home with office follow up in 2 weeks.    Consults: None  Significant Diagnostic Studies:  CBC Latest Ref Rng & Units 09/20/2021 09/13/2021 02/22/2021  WBC 4.0 - 10.5 K/uL 15.4(H) 8.1 9.9  Hemoglobin 12.0 - 15.0 g/dL 10.2(L) 12.8 13.8  Hematocrit 36.0 - 46.0 % 32.3(L) 39.6 42.6  Platelets 150 - 400 K/uL 218 257 309   CMP Latest Ref Rng & Units 09/20/2021 09/13/2021 02/22/2021  Glucose 70 - 99 mg/dL 153(H) 116(H) 81  BUN 6 - 20 mg/dL _0 Creatinine 0.44 - 1.00 mg/dL 0.79 0.63 0.68  Sodium 135 - 145 mmol/L 137 139 135  Potassium 3.5 - 5.1 mmol/L 3.8 3.9 3.5  Chloride 98 - 111 mmol/L 107 107 100  CO2 22 - 32 mmol/L _1 Calcium 8.9 - 10.3 mg/dL 8.7(L) 9.4 10.0  Total Protein 6.5 - 8.1 g/dL 6.2(L) 7.5 7.7  Total Bilirubin 0.3 - 1.2 mg/dL 0.7 0.7 0.6  Alkaline Phos 38 - 126 U/L 58 85 -  AST 15 - 41 U/L 15 14(L) 19  ALT 0 - 44 U/L _2 Treatments: surgery: abdominal myomectomy  Discharge Exam: Blood pressure 102/75, pulse 67, temperature 98.4 F (36.9 C), resp. rate 16, height 5' (1.524 m), weight 77.9 kg, SpO2 96 %. General appearance: alert, cooperative, and appears stated age Extremities: extremities  normal, atraumatic, no cyanosis or edema Incision/Wound: Honeycomb dressing in place and with minimal old blood stain  Disposition: Discharge disposition: 01-Home or Self Care       Discharge Instructions     Discharge patient   Complete by: As directed    Discharge disposition: 01-Home or Self Care   Discharge patient date: 09/20/2021      Allergies as of 09/20/2021   No Known Allergies      Medication List     TAKE these medications    amLODipine 10 MG tablet Commonly known as: NORVASC Take 1 tablet (10 mg total) by mouth daily.   aspirin EC 81 MG tablet Take 81 mg by mouth daily. Swallow whole.   blood glucose meter kit and supplies Kit Dispense based on patient and insurance preference. Use up to four times daily as directed. (FOR ICD-9 250.00, 250.01).   cetirizine 10 MG tablet Commonly known as: ZYRTEC Take 10 mg by mouth at bedtime as needed.   Coricidin HBP 10-325-2 MG Tabs Generic drug: DM-APAP-CPM Take by mouth.   ergocalciferol 1.25 MG (50000 UT) capsule Commonly known as: VITAMIN D2 Take 50,000 Units by mouth once a week.   glucose blood test strip OneTouch Ultra Test strips  USE UP TO FOUR TIMES A DAY AS DIRECTED   ibuprofen 800 MG tablet Commonly known as: ADVIL Take 1 tablet (800 mg total) by mouth 3 (three) times daily.   levonorgestrel  20 MCG/DAY Iud Commonly known as: MIRENA 1 each by Intrauterine route once.   metoprolol succinate 100 MG 24 hr tablet Commonly known as: TOPROL-XL Take 1 tablet (100 mg total) by mouth daily. Take with or immediately following a meal.   oxyCODONE 5 MG immediate release tablet Commonly known as: Oxy IR/ROXICODONE Take 1 tablet (5 mg total) by mouth every 4 (four) hours as needed for moderate pain.         Signed: Linda Hedges 09/20/2021, 8:51 AM

## 2021-11-17 ENCOUNTER — Ambulatory Visit: Payer: 59 | Admitting: Skilled Nursing Facility1

## 2021-12-21 ENCOUNTER — Other Ambulatory Visit: Payer: Self-pay

## 2021-12-21 DIAGNOSIS — I10 Essential (primary) hypertension: Secondary | ICD-10-CM

## 2021-12-21 MED ORDER — METOPROLOL SUCCINATE ER 100 MG PO TB24: 100.0000 mg | ORAL_TABLET | Freq: Every day | ORAL | 0 refills | Status: DC

## 2021-12-21 MED ORDER — AMLODIPINE BESYLATE 10 MG PO TABS: 10.0000 mg | ORAL_TABLET | Freq: Every day | ORAL | 0 refills | Status: DC

## 2022-02-24 ENCOUNTER — Encounter: Payer: Self-pay | Admitting: Nurse Practitioner

## 2022-02-24 ENCOUNTER — Ambulatory Visit: Payer: 59 | Admitting: Nurse Practitioner

## 2022-02-24 ENCOUNTER — Ambulatory Visit (INDEPENDENT_AMBULATORY_CARE_PROVIDER_SITE_OTHER): Payer: Commercial Managed Care - HMO | Admitting: Nurse Practitioner

## 2022-02-24 VITALS — BP 121/99 | HR 94 | Temp 97.2°F | Ht 60.0 in | Wt 175.0 lb

## 2022-02-24 DIAGNOSIS — R7303 Prediabetes: Secondary | ICD-10-CM

## 2022-02-24 DIAGNOSIS — E559 Vitamin D deficiency, unspecified: Secondary | ICD-10-CM | POA: Diagnosis not present

## 2022-02-24 DIAGNOSIS — R0981 Nasal congestion: Secondary | ICD-10-CM

## 2022-02-24 DIAGNOSIS — Z0000 Encounter for general adult medical examination without abnormal findings: Secondary | ICD-10-CM | POA: Diagnosis not present

## 2022-02-24 DIAGNOSIS — I10 Essential (primary) hypertension: Secondary | ICD-10-CM | POA: Diagnosis not present

## 2022-02-24 LAB — POCT GLYCOSYLATED HEMOGLOBIN (HGB A1C)
HbA1c POC (<> result, manual entry): 6.1 % (ref 4.0–5.6)
HbA1c, POC (controlled diabetic range): 6.1 % (ref 0.0–7.0)
HbA1c, POC (prediabetic range): 6.1 % (ref 5.7–6.4)
Hemoglobin A1C: 6.1 % — AB (ref 4.0–5.6)

## 2022-02-24 MED ORDER — METOPROLOL SUCCINATE ER 100 MG PO TB24: 100.0000 mg | ORAL_TABLET | Freq: Every day | ORAL | 1 refills | Status: DC

## 2022-02-24 MED ORDER — ERGOCALCIFEROL 1.25 MG (50000 UT) PO CAPS: 50000.0000 [IU] | ORAL_CAPSULE | ORAL | 0 refills | Status: AC

## 2022-02-24 MED ORDER — CETIRIZINE HCL 10 MG PO TABS: 10.0000 mg | ORAL_TABLET | Freq: Every evening | ORAL | 1 refills | Status: DC | PRN

## 2022-02-24 NOTE — Patient Instructions (Signed)
You were seen today in the [facility] for reevaluation of hypertension and prediabetes. Labs were collected, results will be available via MyChart or, if abnormal, you will be contacted by clinic staff. You were prescribed medications, please take as directed. Please follow up in 6 mths for reevaluation.  ?

## 2022-02-24 NOTE — Progress Notes (Signed)
? ?[facility] ?[place], [place]  [address] ?Phone:  [phone]   Fax:  [phone] ?Subjective:  ? Patient ID: Lacey Carrillo, female    DOB: [birth year], 36 y.o.   MRN: [id number] ? ?Chief Complaint  ?Patient presents with  ? Follow-up  ?  Patient is here today for her 6 month follow up.  ? ?HPI ?Lacey Carrillo 36 y.o. female  has a past medical history of Heart palpitations, History of COVID-19 (2020), Hypertension, Uterine fibroid, and Wears glasses. To the [facility] for follow up visit.  ? ?Hypertension: Patient here for follow-up of elevated blood pressure. She is not exercising and is adherent to low salt diet. Patient does not currently check B/P at home. Cardiac symptoms none. Patient denies none.  Cardiovascular risk factors: hypertension and obesity (BMI >= 30 kg/m2). Use of agents associated with hypertension: none. History of target organ damage: none.  ? ?Patient states that she is compliant with all medications and has been working on improving diet and exercise regimen. States that she recently had a myomectomy for removal of fibroids. Denies any other concerns today. ? ?Denies any fatigue, chest pain, shortness of breath, HA or dizziness. Denies any blurred vision, numbness or tingling. ? ?Past Medical History:  ?Diagnosis Date  ? Heart palpitations   ? per pt followed by pcp, no work-up other than ekg done ,  approx 2014,  (09-12-2021 pt stated controlled w/ metoprolol )  ? History of COVID-19 2020  ? per pt mild symptoms that resolved  ? Hypertension   ? Uterine fibroid   ? Wears glasses   ? ? ?Past Surgical History:  ?Procedure Laterality Date  ? MYOMECTOMY N/A 09/19/2021  ? Procedure: ABDOMINAL MYOMECTOMY;  Surgeon: Linda Hedges, DO;  Location: [facility];  Service: Gynecology;  Laterality: N/A;  ? NO PAST SURGERIES    ? WISDOM TOOTH EXTRACTION    ? ? ?Family History  ?Problem Relation Age of Onset  ? Diabetes Mother   ? Diabetes Father    ? ? ?Social History  ? ?Socioeconomic History  ? Marital status: Single  ?  Spouse name: Not on file  ? Number of children: Not on file  ? Years of education: Not on file  ? Highest education level: Not on file  ?Occupational History  ? Not on file  ?Tobacco Use  ? Smoking status: Never  ? Smokeless tobacco: Never  ?Vaping Use  ? Vaping Use: Never used  ?Substance and Sexual Activity  ? Alcohol use: Not Currently  ? Drug use: Never  ? Sexual activity: Yes  ?  Birth control/protection: I.U.D.  ?Other Topics Concern  ? Not on file  ?Social History Narrative  ? Not on file  ? ?Social Determinants of Health  ? ?Financial Resource Strain: Not on file  ?Food Insecurity: Not on file  ?Transportation Needs: Not on file  ?Physical Activity: Not on file  ?Stress: Not on file  ?Social Connections: Not on file  ?Intimate Partner Violence: Not on file  ? ? ?Outpatient Medications Prior to Visit  ?Medication Sig Dispense Refill  ? levonorgestrel (MIRENA) 20 MCG/DAY IUD 1 each by Intrauterine route once.    ? amLODipine (NORVASC) 10 MG tablet Take 1 tablet (10 mg total) by mouth daily. 90 tablet 0  ? aspirin EC 81 MG tablet Take 81 mg by mouth daily. Swallow whole.    ? blood glucose meter kit and supplies KIT Dispense based on  patient and insurance preference. Use up to four times daily as directed. (FOR ICD-9 250.00, 250.01). 1 each 0  ? cetirizine (ZYRTEC) 10 MG tablet Take 10 mg by mouth at bedtime as needed.    ? ergocalciferol (VITAMIN D2) 1.25 MG (50000 UT) capsule Take 50,000 Units by mouth once a week.    ? glucose blood test strip     ? metoprolol succinate (TOPROL-XL) 100 MG 24 hr tablet Take 1 tablet (100 mg total) by mouth daily. Take with or immediately following a meal. 90 tablet 0  ? CORICIDIN HBP 10-325-2 MG TABS Take by mouth. (Patient not taking: Reported on 02/24/2022)    ? ibuprofen (ADVIL) 800 MG tablet Take 1 tablet (800 mg total) by mouth 3 (three) times daily. (Patient not taking: Reported on 02/24/2022) 30  tablet 0  ? oxyCODONE (OXY IR/ROXICODONE) 5 MG immediate release tablet Take 1 tablet (5 mg total) by mouth every 4 (four) hours as needed for moderate pain. (Patient not taking: Reported on 02/24/2022) 30 tablet 0  ? ?No facility-administered medications prior to visit.  ? ? ?No Known Allergies ? ?Review of Systems  ?Constitutional:  Negative for chills, fever and malaise/fatigue.  ?Respiratory:  Negative for cough and shortness of breath.   ?Cardiovascular:  Negative for chest pain, palpitations and leg swelling.  ?Gastrointestinal:  Negative for abdominal pain, blood in stool, constipation, diarrhea, nausea and vomiting.  ?Genitourinary: Negative.   ?Musculoskeletal: Negative.   ?Skin: Negative.   ?Neurological: Negative.   ?Psychiatric/Behavioral:  Negative for depression. The patient is not nervous/anxious.   ?All other systems reviewed and are negative. ? ?   ?Objective:  ?  ?Physical Exam ?Vitals reviewed.  ?Constitutional:   ?   General: She is not in acute distress. ?   Appearance: Normal appearance. She is obese.  ?HENT:  ?   Head: Normocephalic.  ?Neck:  ?   Vascular: No carotid bruit.  ?Cardiovascular:  ?   Rate and Rhythm: Normal rate and regular rhythm.  ?   Pulses: Normal pulses.  ?   Heart sounds: Normal heart sounds.  ?   Comments: No obvious peripheral edema ?Pulmonary:  ?   Effort: Pulmonary effort is normal.  ?   Breath sounds: Normal breath sounds.  ?Musculoskeletal:     ?   General: No swelling, tenderness, deformity or signs of injury. Normal range of motion.  ?   Cervical back: Normal range of motion and neck supple. No rigidity or tenderness.  ?   Right lower leg: No edema.  ?   Left lower leg: No edema.  ?Lymphadenopathy:  ?   Cervical: No cervical adenopathy.  ?Skin: ?   General: Skin is warm and dry.  ?   Capillary Refill: Capillary refill takes less than 2 seconds.  ?Neurological:  ?   General: No focal deficit present.  ?   Mental Status: She is alert and oriented to person, place, and  time.  ?Psychiatric:     ?   Mood and Affect: Mood normal.     ?   Behavior: Behavior normal.     ?   Thought Content: Thought content normal.     ?   Judgment: Judgment normal.  ? ? ?BP (!) 121/99   Pulse 94   Temp (!) 97.2 ?F (36.2 ?C)   Ht 5' (1.524 m)   Wt 175 lb (79.4 kg)   SpO2 99%   BMI 34.18 kg/m?  ?Wt Readings from Last 3  Encounters:  ?02/24/22 175 lb (79.4 kg)  ?09/19/21 171 lb 12.8 oz (77.9 kg)  ?09/13/21 172 lb 4 oz (78.1 kg)  ? ? ?Immunization History  ?Administered Date(s) Administered  ? Influenza,inj,Quad PF,6+ Mos 09/15/2020, 08/24/2021  ? Moderna Sars-Covid-2 Vaccination 12/22/2019  ? Tdap 09/15/2020  ? Unspecified SARS-COV-2 Vaccination 11/19/2019  ? ? ?Diabetic Foot Exam - Simple   ?No data filed ?  ? ? ?Lab Results  ?Component Value Date  ? TSH 0.78 02/22/2021  ? ?Lab Results  ?Component Value Date  ? WBC 7.2 02/24/2022  ? HGB 13.4 02/24/2022  ? HCT 41.6 02/24/2022  ? MCV 85 02/24/2022  ? PLT 284 02/24/2022  ? ?Lab Results  ?Component Value Date  ? NA 139 02/24/2022  ? K 4.2 02/24/2022  ? CO2 24 02/24/2022  ? GLUCOSE 123 (H) 02/24/2022  ? BUN 13 02/24/2022  ? CREATININE 0.68 02/24/2022  ? BILITOT 0.5 02/24/2022  ? ALKPHOS 126 (H) 02/24/2022  ? AST 14 02/24/2022  ? ALT 20 02/24/2022  ? PROT 7.4 02/24/2022  ? ALBUMIN 4.2 02/24/2022  ? CALCIUM 9.8 02/24/2022  ? ANIONGAP 8 09/20/2021  ? EGFR 116 02/24/2022  ? ?Lab Results  ?Component Value Date  ? CHOL 128 02/24/2022  ? CHOL 148 02/22/2021  ? CHOL 132 09/15/2020  ? ?Lab Results  ?Component Value Date  ? HDL 58 02/24/2022  ? HDL 59 02/22/2021  ? HDL 50 09/15/2020  ? ?Lab Results  ?Component Value Date  ? [place] 60 02/24/2022  ? [place] 75 02/22/2021  ? [place] 67 09/15/2020  ? ?Lab Results  ?Component Value Date  ? TRIG 41 02/24/2022  ? TRIG 63 02/22/2021  ? TRIG 73 09/15/2020  ? ?Lab Results  ?Component Value Date  ? CHOLHDL 2.2 02/24/2022  ? CHOLHDL 2.5 02/22/2021  ? CHOLHDL 2.6 09/15/2020  ? ?Lab Results  ?Component Value Date  ? HGBA1C 6.1  (A) 02/24/2022  ? HGBA1C 6.1 02/24/2022  ? HGBA1C 6.1 02/24/2022  ? HGBA1C 6.1 02/24/2022  ? ? ?   ?Assessment & Plan:  ? ?Problem List Items Addressed This Visit   ? ?  ? Cardiovascular and Mediastinum  ? Essential hy

## 2022-02-25 LAB — CBC WITH DIFFERENTIAL/PLATELET
Basophils Absolute: 0 10*3/uL (ref 0.0–0.2)
Basos: 0 %
EOS (ABSOLUTE): 0.1 10*3/uL (ref 0.0–0.4)
Eos: 2 %
Hematocrit: 41.6 % (ref 34.0–46.6)
Hemoglobin: 13.4 g/dL (ref 11.1–15.9)
Immature Grans (Abs): 0 10*3/uL (ref 0.0–0.1)
Immature Granulocytes: 0 %
Lymphocytes Absolute: 1.5 10*3/uL (ref 0.7–3.1)
Lymphs: 21 %
MCH: 27.5 pg (ref 26.6–33.0)
MCHC: 32.2 g/dL (ref 31.5–35.7)
MCV: 85 fL (ref 79–97)
Monocytes Absolute: 0.5 10*3/uL (ref 0.1–0.9)
Monocytes: 7 %
Neutrophils Absolute: 5 10*3/uL (ref 1.4–7.0)
Neutrophils: 70 %
Platelets: 284 10*3/uL (ref 150–450)
RBC: 4.87 x10E6/uL (ref 3.77–5.28)
RDW: 12.2 % (ref 11.7–15.4)
WBC: 7.2 10*3/uL (ref 3.4–10.8)

## 2022-02-25 LAB — LIPID PANEL
Chol/HDL Ratio: 2.2 ratio (ref 0.0–4.4)
Cholesterol, Total: 128 mg/dL (ref 100–199)
HDL: 58 mg/dL (ref >39)
LDL Chol Calc (NIH): 60 mg/dL (ref 0–99)
Triglycerides: 41 mg/dL (ref 0–149)
VLDL Cholesterol Cal: 10 mg/dL (ref 5–40)

## 2022-02-25 LAB — COMPREHENSIVE METABOLIC PANEL
ALT: 20 IU/L (ref 0–32)
AST: 14 IU/L (ref 0–40)
Albumin/Globulin Ratio: 1.3 (ref 1.2–2.2)
Albumin: 4.2 g/dL (ref 3.8–4.8)
Alkaline Phosphatase: 126 IU/L — ABNORMAL HIGH (ref 44–121)
BUN/Creatinine Ratio: 19 (ref 9–23)
BUN: 13 mg/dL (ref 6–20)
Bilirubin Total: 0.5 mg/dL (ref 0.0–1.2)
CO2: 24 mmol/L (ref 20–29)
Calcium: 9.8 mg/dL (ref 8.7–10.2)
Chloride: 101 mmol/L (ref 96–106)
Creatinine, Ser: 0.68 mg/dL (ref 0.57–1.00)
Globulin, Total: 3.2 g/dL (ref 1.5–4.5)
Glucose: 123 mg/dL — ABNORMAL HIGH (ref 70–99)
Potassium: 4.2 mmol/L (ref 3.5–5.2)
Sodium: 139 mmol/L (ref 134–144)
Total Protein: 7.4 g/dL (ref 6.0–8.5)
eGFR: 116 mL/min/{1.73_m2} (ref >59)

## 2022-02-27 ENCOUNTER — Other Ambulatory Visit: Payer: Self-pay | Admitting: Nurse Practitioner

## 2022-02-27 ENCOUNTER — Telehealth: Payer: Self-pay | Admitting: Nurse Practitioner

## 2022-02-27 DIAGNOSIS — R202 Paresthesia of skin: Secondary | ICD-10-CM

## 2022-02-27 NOTE — Telephone Encounter (Signed)
Requesting neurologist referral. Hands are tingling and hurting in the palm.  ?[facility].  ?Takes her insurance.  ?

## 2022-03-10 ENCOUNTER — Encounter: Payer: Self-pay | Admitting: Nurse Practitioner

## 2022-03-30 ENCOUNTER — Ambulatory Visit: Payer: Self-pay

## 2022-03-31 ENCOUNTER — Ambulatory Visit: Payer: Self-pay | Admitting: Nurse Practitioner

## 2022-04-19 ENCOUNTER — Ambulatory Visit: Payer: Self-pay | Admitting: Nurse Practitioner

## 2022-04-21 ENCOUNTER — Other Ambulatory Visit: Payer: Self-pay | Admitting: Nurse Practitioner

## 2022-04-21 ENCOUNTER — Telehealth: Payer: Self-pay | Admitting: Nurse Practitioner

## 2022-04-21 ENCOUNTER — Ambulatory Visit: Payer: Commercial Managed Care - HMO

## 2022-04-21 DIAGNOSIS — I10 Essential (primary) hypertension: Secondary | ICD-10-CM

## 2022-04-21 MED ORDER — METOPROLOL SUCCINATE ER 100 MG PO TB24: 100.0000 mg | ORAL_TABLET | Freq: Every day | ORAL | 1 refills | Status: DC

## 2022-04-21 NOTE — Telephone Encounter (Signed)
metoprolol succinate refill request

## 2022-04-24 ENCOUNTER — Other Ambulatory Visit: Payer: Self-pay

## 2022-04-24 DIAGNOSIS — I10 Essential (primary) hypertension: Secondary | ICD-10-CM

## 2022-04-24 MED ORDER — METOPROLOL SUCCINATE ER 100 MG PO TB24: 100.0000 mg | ORAL_TABLET | Freq: Every day | ORAL | 1 refills | Status: DC

## 2022-04-24 NOTE — Telephone Encounter (Signed)
Done

## 2022-08-25 ENCOUNTER — Encounter: Payer: Self-pay | Admitting: Nurse Practitioner

## 2022-08-25 ENCOUNTER — Ambulatory Visit (INDEPENDENT_AMBULATORY_CARE_PROVIDER_SITE_OTHER): Payer: Commercial Managed Care - HMO | Admitting: Nurse Practitioner

## 2022-08-25 VITALS — BP 134/96 | HR 90 | Temp 98.4°F | Ht 60.0 in | Wt 176.0 lb

## 2022-08-25 DIAGNOSIS — R002 Palpitations: Secondary | ICD-10-CM | POA: Diagnosis not present

## 2022-08-25 DIAGNOSIS — Z23 Encounter for immunization: Secondary | ICD-10-CM | POA: Diagnosis not present

## 2022-08-25 DIAGNOSIS — R7303 Prediabetes: Secondary | ICD-10-CM | POA: Diagnosis not present

## 2022-08-25 LAB — POCT GLYCOSYLATED HEMOGLOBIN (HGB A1C)
HbA1c, POC (controlled diabetic range): 6 % (ref 0.0–7.0)
HbA1c, POC (prediabetic range): 6 % (ref 5.7–6.4)

## 2022-08-25 NOTE — Patient Instructions (Addendum)
1. Heart palpitations  - EKG 12-Lead - NSR - Ambulatory referral to Cardiology  2. Prediabetes  - HgB A1c  Lab Results  Component Value Date   HGBA1C 6.0 08/25/2022   HGBA1C 6.0 08/25/2022      Follow up:  Follow up in 6 months or sooner if needed

## 2022-08-25 NOTE — Progress Notes (Addendum)
$'@Patient'v$  ID: Lacey Carrillo, female    DOB: December 17, 1985, 36 y.o.   MRN: [id number]  Chief Complaint  Patient presents with   Annual Exam    Pt states she is for annual wellness exam, requesting to have her A1C checked and flu shot     Referring provider: Bo Merino I, NP  HPI  Lacey Carrillo 36 y.o. female  has a past medical history of Heart palpitations, History of COVID-19 (2020), Hypertension, Uterine fibroid, and Wears glasses. To the [facility] for follow up visit.    Patient presents today for follow-up on hypertension and prediabetes.  We will check A1c in office today.  Patient is currently on metoprolol.  She states that she does have intermittent heart palpitations.  We will get an EKG today.  Patient states that she has seen a dietitian and is following diabetic and dash diet. Denies f/c/s, n/v/d, hemoptysis, PND, leg swelling Denies chest pain or edema     No Known Allergies  Immunization History  Administered Date(s) Administered   Influenza,inj,Quad PF,6+ Mos 09/15/2020, 08/24/2021   Moderna Sars-Covid-2 Vaccination 12/22/2019   Tdap 09/15/2020   Unspecified SARS-COV-2 Vaccination 11/19/2019    Past Medical History:  Diagnosis Date   Heart palpitations    per pt followed by pcp, no work-up other than ekg done ,  approx 2014,  (09-12-2021 pt stated controlled w/ metoprolol )   History of COVID-19 2020   per pt mild symptoms that resolved   Hypertension    Uterine fibroid    Wears glasses     Tobacco History: Social History   Tobacco Use  Smoking Status Never  Smokeless Tobacco Never   Counseling given: Not Answered   Outpatient Encounter Medications as of 08/25/2022  Medication Sig   ibuprofen (ADVIL) 800 MG tablet Take 1 tablet (800 mg total) by mouth 3 (three) times daily.   levonorgestrel (MIRENA) 20 MCG/DAY IUD 1 each by Intrauterine route once.   metoprolol succinate (TOPROL-XL) 100 MG 24 hr tablet Take 1 tablet (100 mg total) by mouth daily.  Take with or immediately following a meal.   cetirizine (ZYRTEC) 10 MG tablet Take 1 tablet (10 mg total) by mouth at bedtime as needed.   CORICIDIN HBP 10-325-2 MG TABS Take by mouth. (Patient not taking: Reported on 08/25/2022)   oxyCODONE (OXY IR/ROXICODONE) 5 MG immediate release tablet Take 1 tablet (5 mg total) by mouth every 4 (four) hours as needed for moderate pain. (Patient not taking: Reported on 03/31/2022)   No facility-administered encounter medications on file as of 08/25/2022.     Review of Systems  Review of Systems  Constitutional: Negative.   HENT: Negative.    Cardiovascular: Negative.   Gastrointestinal: Negative.   Allergic/Immunologic: Negative.   Neurological: Negative.   Psychiatric/Behavioral: Negative.         Physical Exam  BP (!) 134/96 (BP Location: Left Arm, Patient Position: Sitting, Cuff Size: Normal)   Pulse 90   Temp 98.4 F (36.9 C) (Oral)   Ht 5' (1.524 m)   Wt 176 lb (79.8 kg)   SpO2 99%   BMI 34.37 kg/m   Wt Readings from Last 5 Encounters:  08/25/22 176 lb (79.8 kg)  03/31/22 177 lb (80.3 kg)  02/24/22 175 lb (79.4 kg)  09/19/21 171 lb 12.8 oz (77.9 kg)  09/13/21 172 lb 4 oz (78.1 kg)     Physical Exam Vitals and nursing note reviewed.  Constitutional:      General: She  is not in acute distress.    Appearance: She is well-developed.  Cardiovascular:     Rate and Rhythm: Normal rate and regular rhythm.  Pulmonary:     Effort: Pulmonary effort is normal.     Breath sounds: Normal breath sounds.  Neurological:     Mental Status: She is alert and oriented to person, place, and time.      Assessment & Plan:   1. Heart palpitations  - EKG 12-Lead - NSR in office today  2. Prediabetes  - HgB A1c  Follow up:  Follow up in 6 months or sooner if needed   Fenton Foy, NP 08/25/2022

## 2022-08-25 NOTE — Assessment & Plan Note (Signed)
-   EKG 12-Lead  2. Prediabetes  - HgB A1c  Follow up:  Follow up in 6 months or sooner if needed

## 2022-08-28 ENCOUNTER — Telehealth: Payer: Self-pay

## 2022-08-28 NOTE — Telephone Encounter (Signed)
Vitamin D refill

## 2022-08-30 ENCOUNTER — Other Ambulatory Visit: Payer: Self-pay | Admitting: Nurse Practitioner

## 2022-08-30 DIAGNOSIS — E559 Vitamin D deficiency, unspecified: Secondary | ICD-10-CM

## 2022-08-30 NOTE — Telephone Encounter (Signed)
It has been a while since that was checked. Please have her come in for lab visit for recheck on that level. Thanks.

## 2022-08-30 NOTE — Telephone Encounter (Signed)
Pt will call back to schedule. [place]

## 2022-09-01 ENCOUNTER — Ambulatory Visit: Payer: Commercial Managed Care - HMO

## 2022-09-01 DIAGNOSIS — E559 Vitamin D deficiency, unspecified: Secondary | ICD-10-CM

## 2022-09-02 LAB — VITAMIN D 25 HYDROXY (VIT D DEFICIENCY, FRACTURES): Vit D, 25-Hydroxy: 23 ng/mL — ABNORMAL LOW (ref 30.0–100.0)

## 2022-09-04 ENCOUNTER — Other Ambulatory Visit: Payer: Self-pay | Admitting: Nurse Practitioner

## 2022-09-04 DIAGNOSIS — E559 Vitamin D deficiency, unspecified: Secondary | ICD-10-CM

## 2022-09-04 MED ORDER — VITAMIN D (ERGOCALCIFEROL) 1.25 MG (50000 UNIT) PO CAPS: 50000.0000 [IU] | ORAL_CAPSULE | ORAL | 0 refills | Status: AC

## 2022-10-17 ENCOUNTER — Ambulatory Visit: Payer: Commercial Managed Care - HMO | Admitting: Cardiovascular Disease

## 2022-10-18 ENCOUNTER — Encounter: Payer: Self-pay | Admitting: Cardiovascular Disease

## 2022-10-18 ENCOUNTER — Ambulatory Visit (INDEPENDENT_AMBULATORY_CARE_PROVIDER_SITE_OTHER): Payer: Commercial Managed Care - HMO

## 2022-10-18 ENCOUNTER — Ambulatory Visit: Payer: Commercial Managed Care - HMO | Attending: Cardiovascular Disease | Admitting: Cardiovascular Disease

## 2022-10-18 VITALS — BP 128/88 | HR 85 | Ht 60.0 in | Wt 179.4 lb

## 2022-10-18 DIAGNOSIS — R002 Palpitations: Secondary | ICD-10-CM

## 2022-10-18 DIAGNOSIS — G4733 Obstructive sleep apnea (adult) (pediatric): Secondary | ICD-10-CM | POA: Diagnosis not present

## 2022-10-18 DIAGNOSIS — I10 Essential (primary) hypertension: Secondary | ICD-10-CM

## 2022-10-18 NOTE — Assessment & Plan Note (Signed)
Long history of palpitations dating back to when she was 11 in high school.  She says that she can feel her heart beat but does not necessarily feel palpitations.  These have gotten better with the addition of metoprolol.  Her TSH was normal.  She drinks 1 cup of coffee a day.  I am going to get a 2D echo and a 2-week Zio patch to further evaluate

## 2022-10-18 NOTE — Progress Notes (Signed)
10/18/2022 Alysiana Elie   16-Jul-1986  [id number]  Primary Physician Fenton Foy, NP Primary Cardiologist: Lorretta Harp MD Lupe Carney, [place]  HPI:  Lacey Carrillo is a 36 y.o. moderate to severely overweight single African-American female with no children who works as a Print production planner.  She was referred by Lazaro Arms, NP for evaluation of palpitations.  Her only cardiac risk factor is treated hypertension.  She does not smoke.  There is no family history for heart disease.  She is never had heart attack or stroke.  She denies chest pain or shortness of breath.  During favorable weather she walks 2-3 times a week.  She did have COVID back in 2020 but was not hospitalized.  She had a myomectomy done last year.  She relates having had palpitations since age 89.  She describes this as being able to "feel her heart beat" fairly consistently.  She is never had syncope or presyncope.  Her symptoms have improved with the addition of beta-blocker.  She does drink 1 cup of coffee every morning.   Current Meds  Medication Sig   CORICIDIN HBP 10-325-2 MG TABS Take by mouth.   levonorgestrel (MIRENA) 20 MCG/DAY IUD 1 each by Intrauterine route once.   metoprolol succinate (TOPROL-XL) 100 MG 24 hr tablet Take 1 tablet (100 mg total) by mouth daily. Take with or immediately following a meal.     No Known Allergies  Social History   Socioeconomic History   Marital status: Single    Spouse name: Not on file   Number of children: Not on file   Years of education: Not on file   Highest education level: Not on file  Occupational History   Not on file  Tobacco Use   Smoking status: Never   Smokeless tobacco: Never  Vaping Use   Vaping Use: Never used  Substance and Sexual Activity   Alcohol use: Not Currently   Drug use: Never   Sexual activity: Yes    Birth control/protection: I.U.D.  Other Topics Concern   Not on file  Social History Narrative   Not on file    Social Determinants of Health   Financial Resource Strain: Not on file  Food Insecurity: Not on file  Transportation Needs: Not on file  Physical Activity: Not on file  Stress: Not on file  Social Connections: Not on file  Intimate Partner Violence: Not on file     Review of Systems: General: negative for chills, fever, night sweats or weight changes.  Cardiovascular: negative for chest pain, dyspnea on exertion, edema, orthopnea, palpitations, paroxysmal nocturnal dyspnea or shortness of breath Dermatological: negative for rash Respiratory: negative for cough or wheezing Urologic: negative for hematuria Abdominal: negative for nausea, vomiting, diarrhea, bright red blood per rectum, melena, or hematemesis Neurologic: negative for visual changes, syncope, or dizziness All other systems reviewed and are otherwise negative except as noted above.    Blood pressure 128/88, pulse 85, height 5' (1.524 m), weight 179 lb 6.4 oz (81.4 kg), SpO2 99 %.  General appearance: alert and no distress Neck: no adenopathy, no carotid bruit, no JVD, supple, symmetrical, trachea midline, and thyroid not enlarged, symmetric, no tenderness/mass/nodules Lungs: clear to auscultation bilaterally Heart: regular rate and rhythm, S1, S2 normal, no murmur, click, rub or gallop Extremities: extremities normal, atraumatic, no cyanosis or edema Pulses: 2+ and symmetric Skin: Skin color, texture, turgor normal. No rashes or lesions Neurologic: Grossly normal  EKG sinus rhythm  at 85 without ST or T wave changes.  Personally reviewed this EKG.  ASSESSMENT AND PLAN:   Essential hypertension History of essential hypertension blood pressure measured today at 128/88.  She is on metoprolol.  Heart palpitations Long history of palpitations dating back to when she was 65 in high school.  She says that she can feel her heart beat but does not necessarily feel palpitations.  These have gotten better with the  addition of metoprolol.  Her TSH was normal.  She drinks 1 cup of coffee a day.  I am going to get a 2D echo and a 2-week Zio patch to further evaluate  Obstructive sleep apnea Ms Tugwell has nocturnal snoring, daytime somnolence and is mildly obese.  I suspect she may have obstructive sleep apnea which could be contributing to her palpitations.  Her Epworth score was 12.     Lorretta Harp MD FACP,FACC,FAHA, [facility] 10/18/2022 1:16 PM

## 2022-10-18 NOTE — Progress Notes (Unsigned)
Enrolled patient for a 14 day Zio XT  monitor to be mailed to patients home  °

## 2022-10-18 NOTE — Assessment & Plan Note (Signed)
History of essential hypertension blood pressure measured today at 128/88.  She is on metoprolol.

## 2022-10-18 NOTE — Patient Instructions (Addendum)
Medication Instructions:  Your physician recommends that you continue on your current medications as directed. Please refer to the Current Medication list given to you today.   *If you need a refill on your cardiac medications before your next appointment, please call your pharmacy*   Testing/Procedures: Your physician has requested that you have an echocardiogram. Echocardiography is a painless test that uses sound waves to create images of your heart. It provides your doctor with information about the size and shape of your heart and how well your heart's chambers and valves are working. This procedure takes approximately one hour. There are no restrictions for this procedure. Please do NOT wear cologne, perfume, aftershave, or lotions (deodorant is allowed). Please arrive 15 minutes prior to your appointment time. This procedure will be done at [address] has recommended that you have a sleep study. This test records several body functions during sleep, including: brain activity, eye movement, oxygen and carbon dioxide blood levels, heart rate and rhythm, breathing rate and rhythm, the flow of air through your mouth and nose, snoring, body muscle movements, and chest and belly movement. Someone will call you to schedule.    ZIO XT- Long Term Monitor Instructions  Your physician has requested you wear a ZIO patch monitor for 14 days.  This is a single patch monitor. [employer] supplies one patch monitor per enrollment. Additional stickers are not available. Please do not apply patch if you will be having a Nuclear Stress Test,  Echocardiogram, Cardiac CT, MRI, or Chest Xray during the period you would be wearing the  monitor. The patch cannot be worn during these tests. You cannot remove and re-apply the  ZIO XT patch monitor.  Your ZIO patch monitor will be mailed 3 day USPS to your address on file. It may take 3-5 days  to receive your monitor after you  have been enrolled.  Once you have received your monitor, please review the enclosed instructions. Your monitor  has already been registered assigning a specific monitor serial # to you.  Billing and Patient Assistance Program Information  We have supplied [employer] with any of your insurance information on file for billing purposes. [employer] offers a sliding scale Patient Assistance Program for patients that do not have  insurance, or whose insurance does not completely cover the cost of the ZIO monitor.  You must apply for the Patient Assistance Program to qualify for this discounted rate.  To apply, please call [employer] at [phone], select option 4, select option 2, ask to apply for  Patient Assistance Program. Theodore Demark will ask your household income, and how many people  are in your household. They will quote your out-of-pocket cost based on that information.  [employer] will also be able to set up a 66-month interest-free payment plan if needed.  Applying the monitor   Shave hair from upper left chest.  Hold abrader disc by orange tab. Rub abrader in 40 strokes over the upper left chest as  indicated in your monitor instructions.  Clean area with 4 enclosed alcohol pads. Let dry.  Apply patch as indicated in monitor instructions. Patch will be placed under collarbone on left  side of chest with arrow pointing upward.  Rub patch adhesive wings for 2 minutes. Remove white label marked "1". Remove the white  label marked "2". Rub patch adhesive wings for 2 additional minutes.  While looking in a mirror, press and release button in center of patch. A  small green light will  flash 3-4 times. This will be your only indicator that the monitor has been turned on.  Do not shower for the first 24 hours. You may shower after the first 24 hours.  Press the button if you feel a symptom. You will hear a small click. Record Date, Time and  Symptom in the Patient Logbook.  When you are ready to  remove the patch, follow instructions on the last 2 pages of Patient  Logbook. Stick patch monitor onto the last page of Patient Logbook.  Place Patient Logbook in the blue and white box. Use locking tab on box and tape box closed  securely. The blue and white box has prepaid postage on it. Please place it in the mailbox as  soon as possible. Your physician should have your test results approximately 7 days after the  monitor has been mailed back to [facility].  Call [facility] at [phone] if you have questions regarding  your ZIO XT patch monitor. Call them immediately if you see an orange light blinking on your  monitor.  If your monitor falls off in less than 4 days, contact our Monitor department at [phone].  If your monitor becomes loose or falls off after 4 days call [employer] at [phone] for  suggestions on securing your monitor    Follow-Up: At [facility], you and your health needs are our priority.  As part of our continuing mission to provide you with exceptional heart care, we have created designated Provider Care Teams.  These Care Teams include your primary Cardiologist (physician) and Advanced Practice Providers (APPs -  Physician Assistants and Nurse Practitioners) who all work together to provide you with the care you need, when you need it.  We recommend signing up for the patient portal called "MyChart".  Sign up information is provided on this After Visit Summary.  MyChart is used to connect with patients for Virtual Visits (Telemedicine).  Patients are able to view lab/test results, encounter notes, upcoming appointments, etc.  Non-urgent messages can be sent to your provider as well.   To learn more about what you can do with MyChart, go to [email or web address].    Your next appointment:   3 month(s)  The format for your next appointment:   In Person  Provider:   Quay Burow, MD

## 2022-10-18 NOTE — Assessment & Plan Note (Addendum)
Lacey Carrillo has nocturnal snoring, daytime somnolence and is mildly obese.  I suspect she may have obstructive sleep apnea which could be contributing to her palpitations.  Her Epworth score was 12.

## 2022-10-20 NOTE — Addendum Note (Signed)
Addended by: Deanna Artis A on: 10/20/2022 07:57 AM   Modules accepted: Orders

## 2022-10-25 NOTE — Addendum Note (Signed)
Addended by: Beatrix Fetters on: 10/25/2022 11:08 AM   Modules accepted: Orders

## 2022-10-31 ENCOUNTER — Other Ambulatory Visit: Payer: Self-pay | Admitting: Cardiovascular Disease

## 2022-10-31 DIAGNOSIS — R400 Somnolence: Secondary | ICD-10-CM

## 2022-10-31 DIAGNOSIS — R0683 Snoring: Secondary | ICD-10-CM

## 2022-11-02 ENCOUNTER — Telehealth: Payer: Self-pay | Admitting: Nurse Practitioner

## 2022-11-02 NOTE — Telephone Encounter (Signed)
You can get it at any time after having COVID. She should have some natural immunity built up for around 3 months though.

## 2022-11-02 NOTE — Telephone Encounter (Signed)
Patient LVM on nurse line asking how long after being diagnosed with Covid do you have to wait before getting the booster vaccine. Please call.

## 2022-11-16 ENCOUNTER — Ambulatory Visit (HOSPITAL_COMMUNITY): Payer: 59 | Attending: Cardiology

## 2022-11-16 DIAGNOSIS — I10 Essential (primary) hypertension: Secondary | ICD-10-CM | POA: Insufficient documentation

## 2022-11-16 DIAGNOSIS — R002 Palpitations: Secondary | ICD-10-CM

## 2022-11-16 DIAGNOSIS — G4733 Obstructive sleep apnea (adult) (pediatric): Secondary | ICD-10-CM | POA: Insufficient documentation

## 2022-11-16 LAB — ECHOCARDIOGRAM COMPLETE
Area-P 1/2: 3.58 cm2
S' Lateral: 2.5 cm

## 2022-11-21 ENCOUNTER — Ambulatory Visit (HOSPITAL_BASED_OUTPATIENT_CLINIC_OR_DEPARTMENT_OTHER): Payer: 59 | Attending: Cardiovascular Disease | Admitting: Cardiovascular Disease

## 2022-11-21 DIAGNOSIS — R400 Somnolence: Secondary | ICD-10-CM | POA: Insufficient documentation

## 2022-11-21 DIAGNOSIS — R0683 Snoring: Secondary | ICD-10-CM | POA: Diagnosis not present

## 2022-12-08 ENCOUNTER — Encounter (HOSPITAL_BASED_OUTPATIENT_CLINIC_OR_DEPARTMENT_OTHER): Payer: Self-pay | Admitting: Cardiovascular Disease

## 2022-12-08 ENCOUNTER — Telehealth: Payer: Self-pay | Admitting: *Deleted

## 2022-12-08 NOTE — Telephone Encounter (Signed)
-----   Message from Troy Sine, MD sent at 12/08/2022  9:30 AM EST ----- Mariann Laster, please notify pt of results. No need for therapy, study normal.

## 2022-12-08 NOTE — Procedures (Signed)
     Patient Name: Lacey, Carrillo Date: 11/21/2022 Gender: Female D.O.B: 1986/02/01 Age (years): 36 Referring Provider: Lorretta Harp Height (inches): 90 Interpreting Physician: Shelva Majestic MD, ABSM Weight (lbs): 175 RPSGT: Jacolyn Reedy BMI: 34 MRN: [id number] Neck Size: 14.00  CLINICAL INFORMATION Sleep Study Type: HST  Indication for sleep study: fatigue, obesity  Epworth Sleepiness Score: 7  SLEEP STUDY TECHNIQUE A multi-channel overnight portable sleep study was performed. The channels recorded were: nasal airflow, thoracic respiratory movement, and oxygen saturation with a pulse oximetry. Snoring was also monitored.  MEDICATIONS cetirizine (ZYRTEC) 10 MG tablet (Expired) CORICIDIN HBP 10-325-2 MG TABS levonorgestrel (MIRENA) 20 MCG/DAY IUD metoprolol succinate (TOPROL-XL) 100 MG 24 hr tablet Patient self administered medications include: N/A.  SLEEP ARCHITECTURE Patient was studied for 287.6 minutes. The sleep efficiency was 100.0 % and the patient was supine for 0%. The arousal index was 0.0 per hour.  RESPIRATORY PARAMETERS The overall AHI was 4.6 per hour, with a central apnea index of 0 per hour.  The oxygen nadir was 94% during sleep. (78% recorded during artifact with channel failure).  CARDIAC DATA Mean heart rate during sleep was 82.6 bpm.  IMPRESSIONS - No significant obstructive sleep apnea occurred during this study (AHI 4.6/h). - No oxygen desaturation was noted during this study  - Patient snored 0.1% during the sleep.  DIAGNOSIS - Relatively normal study - Minimal increased upper airway resistance.  RECOMMENDATIONS - There is no indication for CPAP. - Effort should be made to optimize nasal and oropharyngeal patency. - Avoid alcohol, sedatives and other CNS depressants that may worsen sleep apnea and disrupt normal sleep architecture. - Sleep hygiene should be reviewed to assess factors that may improve sleep quality. -  Weight management (BMI 34) and regular exercise should be initiated or continued.   [Electronically signed] 12/08/2022 09:24 AM  Shelva Majestic MD, [facility], [place], American Board of Sleep Medicine  NPI: [id number]  [place] PH: [phone]   FX: [phone] [place]

## 2022-12-08 NOTE — Telephone Encounter (Signed)
Patient notified of normal sleep study results.

## 2022-12-13 DIAGNOSIS — R002 Palpitations: Secondary | ICD-10-CM | POA: Diagnosis not present

## 2022-12-13 DIAGNOSIS — I10 Essential (primary) hypertension: Secondary | ICD-10-CM | POA: Diagnosis not present

## 2022-12-19 ENCOUNTER — Telehealth: Payer: Self-pay | Admitting: Cardiovascular Disease

## 2022-12-19 NOTE — Telephone Encounter (Signed)
Spoke with pt, aware of monitor results. 

## 2022-12-19 NOTE — Telephone Encounter (Signed)
Pt calling for monitor results

## 2023-02-13 ENCOUNTER — Encounter: Payer: Self-pay | Admitting: Cardiovascular Disease

## 2023-02-13 ENCOUNTER — Ambulatory Visit: Payer: 59 | Attending: Cardiovascular Disease | Admitting: Cardiovascular Disease

## 2023-02-13 VITALS — BP 132/80 | HR 88 | Ht 60.0 in | Wt 178.6 lb

## 2023-02-13 DIAGNOSIS — I10 Essential (primary) hypertension: Secondary | ICD-10-CM | POA: Diagnosis not present

## 2023-02-13 DIAGNOSIS — R002 Palpitations: Secondary | ICD-10-CM | POA: Diagnosis not present

## 2023-02-13 DIAGNOSIS — G4733 Obstructive sleep apnea (adult) (pediatric): Secondary | ICD-10-CM

## 2023-02-13 NOTE — Assessment & Plan Note (Signed)
History of palpitations for which she was centimeters initially.  She did have an event monitor performed 12/18/2022 that showed some occasional PACs and PVCs.  She is on a beta-blocker.  Since I saw her she no longer feels her palpitations.  Her 2D echo was normal except for mild LVH.

## 2023-02-13 NOTE — Progress Notes (Signed)
02/13/2023 Shacora Karwowski   1985/12/07  [id number]  Primary Physician Ivonne Andrew, NP Primary Cardiologist: Runell Gess MD Nicholes Calamity, [place]  HPI:  Lacey Carrillo is a 37 y.o.  moderate to severely overweight single African-American female with no children who works as a Manufacturing systems engineer. She was referred by Angus Seller, NP for evaluation of palpitations.  I last saw her in the office 10/18/2022.  Her only cardiac risk factor is treated hypertension. She does not smoke. There is no family history for heart disease. She is never had heart attack or stroke. She denies chest pain or shortness of breath. During favorable weather she walks 2-3 times a week. She did have COVID back in 2020 but was not hospitalized. She had a myomectomy done last year. She relates having had palpitations since age 13. She describes this as being able to "feel her heart beat" fairly consistently. She is never had syncope or presyncope. Her symptoms have improved with the addition of beta-blocker. She does drink 1 cup of coffee every morning.  Since I saw her I did get a 2D echocardiogram on her 11/16/2022 that was essentially normal except for mild LVH.  A 2-week Zio patch performed 12/18/2022 showed only occasional PACs and PVCs which interestingly she no longer feels.  She was evaluated for sleep apnea by Dr. Tresa Endo who did not feel that she had this.   Current Meds  Medication Sig   cetirizine (ZYRTEC) 10 MG tablet Take 1 tablet (10 mg total) by mouth at bedtime as needed.   CORICIDIN HBP 10-325-2 MG TABS Take by mouth.   ergocalciferol (VITAMIN D2) 1.25 MG (50000 UT) capsule Take 50,000 Units by mouth once a week.   levonorgestrel (MIRENA) 20 MCG/DAY IUD 1 each by Intrauterine route once.   metoprolol succinate (TOPROL-XL) 100 MG 24 hr tablet Take 1 tablet (100 mg total) by mouth daily. Take with or immediately following a meal.     No Known Allergies  Social History   Socioeconomic History    Marital status: Single    Spouse name: Not on file   Number of children: Not on file   Years of education: Not on file   Highest education level: Not on file  Occupational History   Not on file  Tobacco Use   Smoking status: Never   Smokeless tobacco: Never  Vaping Use   Vaping Use: Never used  Substance and Sexual Activity   Alcohol use: Not Currently   Drug use: Never   Sexual activity: Yes    Birth control/protection: I.U.D.  Other Topics Concern   Not on file  Social History Narrative   Not on file   Social Determinants of Health   Financial Resource Strain: Not on file  Food Insecurity: Not on file  Transportation Needs: Not on file  Physical Activity: Not on file  Stress: Not on file  Social Connections: Not on file  Intimate Partner Violence: Not on file     Review of Systems: General: negative for chills, fever, night sweats or weight changes.  Cardiovascular: negative for chest pain, dyspnea on exertion, edema, orthopnea, palpitations, paroxysmal nocturnal dyspnea or shortness of breath Dermatological: negative for rash Respiratory: negative for cough or wheezing Urologic: negative for hematuria Abdominal: negative for nausea, vomiting, diarrhea, bright red blood per rectum, melena, or hematemesis Neurologic: negative for visual changes, syncope, or dizziness All other systems reviewed and are otherwise negative except as noted above.  Blood pressure (!) 142/102, pulse 88, height 5' (1.524 m), weight 178 lb 9.6 oz (81 kg), SpO2 98 %.  General appearance: alert and no distress Neck: no adenopathy, no carotid bruit, no JVD, supple, symmetrical, trachea midline, and thyroid not enlarged, symmetric, no tenderness/mass/nodules Lungs: clear to auscultation bilaterally Heart: regular rate and rhythm, S1, S2 normal, no murmur, click, rub or gallop Extremities: extremities normal, atraumatic, no cyanosis or edema Pulses: 2+ and symmetric Skin: Skin color,  texture, turgor normal. No rashes or lesions Neurologic: Grossly normal  EKG not performed today  ASSESSMENT AND PLAN:   Essential hypertension History of essential hypertension blood pressure measured today at 142/102.  She is on metoprolol she has not yet to take this morning.  She does admit to dietary indiscretion with regards to salt.  Heart palpitations History of palpitations for which she was centimeters initially.  She did have an event monitor performed 12/18/2022 that showed some occasional PACs and PVCs.  She is on a beta-blocker.  Since I saw her she no longer feels her palpitations.  Her 2D echo was normal except for mild LVH.  Obstructive sleep apnea Patient was evaluated by Dr. Tresa Endo who did not feel that she had sleep apnea requiring CPAP.     Runell Gess MD FACP,FACC,FAHA, [facility] 02/13/2023 8:27 AM

## 2023-02-13 NOTE — Assessment & Plan Note (Signed)
History of essential hypertension blood pressure measured today at 142/102.  She is on metoprolol she has not yet to take this morning.  She does admit to dietary indiscretion with regards to salt.

## 2023-02-13 NOTE — Assessment & Plan Note (Signed)
Patient was evaluated by Dr. Tresa Endo who did not feel that she had sleep apnea requiring CPAP.

## 2023-02-13 NOTE — Patient Instructions (Signed)
Medication Instructions:  Your physician recommends that you continue on your current medications as directed. Please refer to the Current Medication list given to you today.  *If you need a refill on your cardiac medications before your next appointment, please call your pharmacy*   Follow-Up: At [facility], you and your health needs are our priority.  As part of our continuing mission to provide you with exceptional heart care, we have created designated Provider Care Teams.  These Care Teams include your primary Cardiologist (physician) and Advanced Practice Providers (APPs -  Physician Assistants and Nurse Practitioners) who all work together to provide you with the care you need, when you need it.  We recommend signing up for the patient portal called "MyChart".  Sign up information is provided on this After Visit Summary.  MyChart is used to connect with patients for Virtual Visits (Telemedicine).  Patients are able to view lab/test results, encounter notes, upcoming appointments, etc.  Non-urgent messages can be sent to your provider as well.   To learn more about what you can do with MyChart, go to [email or web address].    Your next appointment:   We will see you on an as needed basis.  Provider:   Jonathan Berry, MD  

## 2023-02-23 ENCOUNTER — Ambulatory Visit (INDEPENDENT_AMBULATORY_CARE_PROVIDER_SITE_OTHER): Payer: 59 | Admitting: Nurse Practitioner

## 2023-02-23 ENCOUNTER — Encounter: Payer: Self-pay | Admitting: Nurse Practitioner

## 2023-02-23 VITALS — BP 145/98 | HR 97 | Temp 97.8°F | Ht 60.0 in | Wt 177.0 lb

## 2023-02-23 DIAGNOSIS — I10 Essential (primary) hypertension: Secondary | ICD-10-CM | POA: Diagnosis not present

## 2023-02-23 DIAGNOSIS — J302 Other seasonal allergic rhinitis: Secondary | ICD-10-CM | POA: Diagnosis not present

## 2023-02-23 DIAGNOSIS — E559 Vitamin D deficiency, unspecified: Secondary | ICD-10-CM | POA: Diagnosis not present

## 2023-02-23 DIAGNOSIS — Z13220 Encounter for screening for lipoid disorders: Secondary | ICD-10-CM | POA: Diagnosis not present

## 2023-02-23 DIAGNOSIS — R7303 Prediabetes: Secondary | ICD-10-CM | POA: Diagnosis not present

## 2023-02-23 DIAGNOSIS — R0981 Nasal congestion: Secondary | ICD-10-CM

## 2023-02-23 LAB — POCT GLYCOSYLATED HEMOGLOBIN (HGB A1C): Hemoglobin A1C: 5.6 % (ref 4.0–5.6)

## 2023-02-23 MED ORDER — CETIRIZINE HCL 10 MG PO TABS: 10.0000 mg | ORAL_TABLET | Freq: Every evening | ORAL | 1 refills | Status: DC | PRN

## 2023-02-23 MED ORDER — ERGOCALCIFEROL 1.25 MG (50000 UT) PO CAPS: 50000.0000 [IU] | ORAL_CAPSULE | ORAL | 2 refills | Status: DC

## 2023-02-23 MED ORDER — METOPROLOL SUCCINATE ER 100 MG PO TB24
100.0000 mg | ORAL_TABLET | Freq: Every day | ORAL | 1 refills | Status: DC
Start: 2023-02-23 — End: 2023-09-17

## 2023-02-23 NOTE — Assessment & Plan Note (Signed)
-   POCT glycosylated hemoglobin (Hb A1C) - CBC - Comprehensive metabolic panel   2. Lipid screening  - Lipid Panel   3. Essential hypertension  - CBC - Comprehensive metabolic panel    Follow up:  Follow up in 6 months

## 2023-02-23 NOTE — Patient Instructions (Addendum)
1. Prediabetes  - POCT glycosylated hemoglobin (Hb A1C) - CBC - Comprehensive metabolic panel   2. Lipid screening  - Lipid Panel   3. Essential hypertension  - CBC - Comprehensive metabolic panel    Follow up:  Follow up in 6 months    Health Maintenance, Female Adopting a healthy lifestyle and getting preventive care are important in promoting health and wellness. Ask your health care provider about: The right schedule for you to have regular tests and exams. Things you can do on your own to prevent diseases and keep yourself healthy. What should I know about diet, weight, and exercise? Eat a healthy diet  Eat a diet that includes plenty of vegetables, fruits, low-fat dairy products, and lean protein. Do not eat a lot of foods that are high in solid fats, added sugars, or sodium. Maintain a healthy weight Body mass index (BMI) is used to identify weight problems. It estimates body fat based on height and weight. Your health care provider can help determine your BMI and help you achieve or maintain a healthy weight. Get regular exercise Get regular exercise. This is one of the most important things you can do for your health. Most adults should: Exercise for at least 150 minutes each week. The exercise should increase your heart rate and make you sweat (moderate-intensity exercise). Do strengthening exercises at least twice a week. This is in addition to the moderate-intensity exercise. Spend less time sitting. Even light physical activity can be beneficial. Watch cholesterol and blood lipids Have your blood tested for lipids and cholesterol at 37 years of age, then have this test every 5 years. Have your cholesterol levels checked more often if: Your lipid or cholesterol levels are high. You are older than 37 years of age. You are at high risk for heart disease. What should I know about cancer screening? Depending on your health history and family history, you may  need to have cancer screening at various ages. This may include screening for: Breast cancer. Cervical cancer. Colorectal cancer. Skin cancer. Lung cancer. What should I know about heart disease, diabetes, and high blood pressure? Blood pressure and heart disease High blood pressure causes heart disease and increases the risk of stroke. This is more likely to develop in people who have high blood pressure readings or are overweight. Have your blood pressure checked: Every 3-5 years if you are 71-58 years of age. Every year if you are 55 years old or older. Diabetes Have regular diabetes screenings. This checks your fasting blood sugar level. Have the screening done: Once every three years after age 62 if you are at a normal weight and have a low risk for diabetes. More often and at a younger age if you are overweight or have a high risk for diabetes. What should I know about preventing infection? Hepatitis B If you have a higher risk for hepatitis B, you should be screened for this virus. Talk with your health care provider to find out if you are at risk for hepatitis B infection. Hepatitis C Testing is recommended for: Everyone born from [birth year] through 1965. Anyone with known risk factors for hepatitis C. Sexually transmitted infections (STIs) Get screened for STIs, including gonorrhea and chlamydia, if: You are sexually active and are younger than 37 years of age. You are older than 37 years of age and your health care provider tells you that you are at risk for this type of infection. Your sexual activity has changed since you  were last screened, and you are at increased risk for chlamydia or gonorrhea. Ask your health care provider if you are at risk. Ask your health care provider about whether you are at high risk for HIV. Your health care provider may recommend a prescription medicine to help prevent HIV infection. If you choose to take medicine to prevent HIV, you should first get  tested for HIV. You should then be tested every 3 months for as long as you are taking the medicine. Pregnancy If you are about to stop having your period (premenopausal) and you may become pregnant, seek counseling before you get pregnant. Take 400 to 800 micrograms (mcg) of folic acid every day if you become pregnant. Ask for birth control (contraception) if you want to prevent pregnancy. Osteoporosis and menopause Osteoporosis is a disease in which the bones lose minerals and strength with aging. This can result in bone fractures. If you are 40 years old or older, or if you are at risk for osteoporosis and fractures, ask your health care provider if you should: Be screened for bone loss. Take a calcium or vitamin D supplement to lower your risk of fractures. Be given hormone replacement therapy (HRT) to treat symptoms of menopause. Follow these instructions at home: Alcohol use Do not drink alcohol if: Your health care provider tells you not to drink. You are pregnant, may be pregnant, or are planning to become pregnant. If you drink alcohol: Limit how much you have to: 0-1 drink a day. Know how much alcohol is in your drink. In the [place] one drink equals one 12 oz bottle of beer (355 mL), one 5 oz glass of wine (148 mL), or one 1 oz glass of hard liquor (44 mL). Lifestyle Do not use any products that contain nicotine or tobacco. These products include cigarettes, chewing tobacco, and vaping devices, such as e-cigarettes. If you need help quitting, ask your health care provider. Do not use street drugs. Do not share needles. Ask your health care provider for help if you need support or information about quitting drugs. General instructions Schedule regular health, dental, and eye exams. Stay current with your vaccines. Tell your health care provider if: You often feel depressed. You have ever been abused or do not feel safe at home. Summary Adopting a healthy lifestyle and getting  preventive care are important in promoting health and wellness. Follow your health care provider's instructions about healthy diet, exercising, and getting tested or screened for diseases. Follow your health care provider's instructions on monitoring your cholesterol and blood pressure. This information is not intended to replace advice given to you by your health care provider. Make sure you discuss any questions you have with your health care provider. Document Revised: 02/28/2021 Document Reviewed: 02/28/2021 Elsevier Patient Education  2023 [employer]  Lipid Profile Test Why am I having this test? The lipid profile test can be used to help evaluate your risk for developing heart disease. The test is also used to monitor your levels during treatment for high cholesterol to see if you are reaching your goals. What is being tested? A lipid profile measures the following: Total cholesterol. Cholesterol is a waxy, fat-like substance in your blood. If your total cholesterol level is high, this can increase your risk for heart disease. High-density lipoprotein (HDL). This is known as the good cholesterol. Having high levels of HDL decreases your risk for heart disease. Your HDL level may be low if you smoke or do not get enough  exercise. Low-density lipoprotein (LDL). This is known as the bad cholesterol. This type causes plaque to build up in your arteries. Having a low level of LDL is best. Having high levels of LDL increases your risk for heart disease. Cholesterol to HDL ratio. This is calculated by dividing your total cholesterol by your HDL cholesterol. The ratio is used by health care providers to determine your risk for heart disease. A low ratio is best. Triglycerides. These are fats that your body can store or burn for energy. Low levels are best. Having high levels of triglycerides increases your risk for heart disease. What kind of sample is taken?  A blood sample is required for this  test. It is usually collected by inserting a needle into a blood vessel. How do I prepare for this test? Follow instructions from your health care provider about changing or stopping your regular medicines. Do not eat or drink anything except water starting 9-12 hours before your test, or as long as told by your health care provider. Do not drink alcohol starting at least 24 hours before your test. Follow any instructions from your health care provider about dietary restrictions before your test. Tell a health care provider about: All medicines you are taking, including vitamins, herbs, eye drops, creams, and over-the-counter medicines. Any medical conditions you have. Whether you are pregnant or may be pregnant. How are the results reported? Your test results will be reported as values that indicate your cholesterol and triglyceride levels. Your health care provider will compare your results to normal ranges that were established after testing a large group of people (reference ranges). Reference ranges may vary among labs and hospitals. For this test, common reference ranges are: Total cholesterol Adult or elderly: less than 200 mg/dL. Child: 120-200 mg/dL. HDL Female: greater than 45 mg/dL. Female: greater than 55 mg/dL. HDL reference values indicating your risk for heart disease: Low risk for heart disease: Female: 60 mg/dL. Female: 70 mg/dL. Moderate risk for heart disease: Female: 45 mg/dL. Female: 55 mg/dL. High risk for heart disease: Female: 25 mg/dL. Female: 35 mg/dL. LDL Adults: Your health care provider will determine a target level for LDL based on your risk for heart disease. If you are at low risk, your LDL should be 130 mg/dL or less. If you are at moderate risk, your LDL should be 100 mg/dL or less. If you are at high risk, your LDL should be 70 mg/dL or less. Children: less than 110 mg/dL. Cholesterol to HDL ratio Reference values indicating your risk for heart  disease: Risk that is half the average risk: Female: 3.4. Female: 3.3. Average risk: Female: 5.0. Female: 4.4. Risk that is two times average (moderate risk): Female: 10.0. Female: 7.0. Risk that is three times average (high risk): Female: 24.0. Female: 11.0. Triglycerides Adult or elderly: Female: 40-160 mg/dL. Female: 35-135 mg/dL. Children 37-19 years old: Female: 40-163 mg/dL. Female: 40-128 mg/dL. Children 60-89 years old: Female: 36-138 mg/dL. Female: 41-138 mg/dL. Children 65-83 years old: Female: 31-108 mg/dL. Female: 35-114 mg/dL. Children 41-2 years old: Female: 30-86 mg/dL. Female: 32-99 mg/dL. Triglycerides should be less than 400 mg/dL even when you are not fasting. What do the results mean? Results that are within the reference ranges are considered normal. Total cholesterol, LDL, and triglyceride levels that are higher than the reference ranges can mean that you have an increased risk for heart disease. An HDL level that is lower than the reference range can also indicate an increased risk. Talk with  your health care provider about what your results mean. Questions to ask your health care provider Ask your health care provider, or the department that is doing the test: When will my results be ready? How will I get my results? What are my treatment options? What other tests do I need? What are my next steps? Summary The lipid profile test can be used to help predict the likelihood that you will develop heart disease. It can also help monitor your cholesterol levels during treatment. A lipid profile measures your total cholesterol, high-density lipoprotein (HDL), low-density lipoprotein (LDL), cholesterol to HDL ratio, and triglycerides. Total cholesterol, LDL, and triglyceride levels that are higher than the reference ranges can indicate an increased risk for heart disease. An HDL level that is lower than the reference range can indicate an increased risk for heart  disease. Talk with your health care provider about what your results mean. This information is not intended to replace advice given to you by your health care provider. Make sure you discuss any questions you have with your health care provider. Document Revised: 01/19/2021 Document Reviewed: 01/19/2021 Elsevier Patient Education  2023 [employer]   Comprehensive Metabolic Panel Why am I having this test? Your health care provider may order the comprehensive metabolic panel (CMP) to check the level of numerous substances in your blood. These include salts and minerals in your body (electrolytes). It also checks the balance of body fluids (hydration or dehydration). The test results can also help tell you and your health care provider how well your liver and kidneys are working. What is being tested? The comprehensive metabolic panel measures the levels of the following substances in your blood: Glucose. Glucose is a simple sugar that serves as the main source of energy for your body. Creatinine. Creatinine is a waste product of normal muscle activity. It is excreted from the body by the kidneys. Blood urea nitrogen (BUN). Urea nitrogen is a waste product of protein breakdown. It is produced when excess protein in your body is broken down and used for energy. It is excreted by the kidneys. Electrolytes. Electrolytes are salts and minerals in the body that help keep the amount of water in your body in balance. They also help move nutrients into, and waste out of, the body, and help muscles and nerves function properly. The electrolytes include: Potassium (K+). Sodium (Na+). Chloride (Cl-). Calcium (Ca). Bicarbonate (HCO3-). Alkaline phosphatase (ALP). This is a protein that is found in all tissues of your body. The bones, liver, and bile ducts have the highest amounts. Alanine aminotransferase (ALT). ALT is an enzyme found throughout the body. The highest concentrations of ALT are in the  tissues of the liver. If your liver is injured, there will also be ALT in your bloodstream. Aspartate aminotransferase (AST). This is another enzyme found mostly in your liver. There is also a high concentration in your muscle cells and heart. Testing for AST helps check for liver damage. Bilirubin. As the liver breaks down red blood cells, it produces a waste product called bilirubin. A high level of bilirubin can indicate certain health problems. Albumin. This is a protein that is a major component of the liquid part of blood (serum). It is made by the liver and can be measured in the bloodstream. Total protein. This is a measurement of the amount of the two types of protein found in blood serum--albumin and globulins. Globulins are part of your body's defense (immune) system. What kind of sample is taken?  A blood sample is required for this test. It is usually collected by inserting a needle into a blood vessel. How do I prepare for this test? You may need to stop eating or drinking for a certain amount of time before your blood sample is taken. Follow instructions from your health care provider about eating and drinking. How are the results reported? Your test results will be reported as values. Your health care provider will compare your results to normal ranges that were established after testing a large group of people (reference ranges). Reference ranges may vary among labs and hospitals. For this test, common reference ranges are: Glucose Umbilical cord: 45-96 mg/dL or 7.8-2.9 mmol/L (SI units). Premature infant: 20-60 mg/dL or 5.6-2.1 mmol/L. Neonate: 30-60 mg/dL or 3.0-8.6 mmol/L. Infant: 40-90 mg/dL or 5.7-8.4 mmol/L. Child under 36 years old: 60-100 mg/dL or 6.9-6.2 mmol/L. Adult or child over 44 years old: Fasting: 70-110 mg/dL or less than 6.1 mmol/L. Random (non-fasting): less than or equal to 200 mg/dL or less than 95.2 mmol/L. Creatinine Newborn: 0.3-1.2 mg/dL. Infant: 0.2-0.4  mg/dL. Child: 0.3-0.7 mg/dL. Adolescent: 0.5-1 mg/dL. Adult: Female: 0.5-1.1 mg/dL or 84-13 micromoles/L (SI units). Female: 0.6-1.2 mg/dL or 24-401 micromoles/L (SI units). BUN Umbilical cord: 21-40 mg/dL. Newborn: 3-12 mg/dL. Infant: 5-18 mg/dL. Child: 5-18 mg/dL. Adult: 10-20 mg/dL or 0.2-7.2 mmol/L (SI units). Potassium (K+) Newborn: 3.9-5.9 mEq/L. Infant: 4.1-5.3 mEq/L. Child: 3.4-4.7 mEq/L. Adult or elderly: 3.5-5.0 mEq/L or 3.5-5.0 mmol/L (SI units). Sodium (Na+) Newborn: 134-144 mEq/L. Infant: 134-150 mEq/L. Child: 136-145 mEq/L. Adult or elderly: 136-145 mEq/L or 136-145 mmol/L (SI units). Chloride (Cl-) Premature infant: 95-110 mEq/L. Newborn: 96-106 mEq/L. Child: 90-110 mEq/L. Adult or elderly: 98-106 mEq/L or 98-106 mmol/L (SI units). Calcium (Ca) Total calcium: Umbilical cord: 9.0-11.5 mg/dL or 5.36-6.44 mmol/L. Newborn less than 1 days old: 7.6-10.4 mg/dL or 0.3-4.74 mmol/L. Infant 72 days to 10 years old: 9.0-10.6 mg/dL or 2.5-9.56 mmol/L. Child: 8.8-10.8 mg/dL or 3.8-7.5 mmol/L. Adult: 9.0-10.5 mg/dL or 6.43-3.29 mmol/L. Ionized calcium: Newborn: 4.20-5.58 mg/dL or 5.18-8.41 mmol/L. Child 2 months to 71 years old: 4.80-5.52 mg/dL or 6.60-6.30 mmol/L. Adult: 4.5-5.6 mg/dL or 1.60-1.09 mmol/L. Bicarbonate (HCO3-) Newborn: 13-22 mEq/L. Infant: 20-28 mEq/L. Child: 20-28 mEq/L. Adult or elderly: 23-30 mEq/L or 23-30 mmol/L (SI units). ALP Child under 60 years old: 85-235 units/L. 7-72 years old: 65-210 units/L. 19-67 years old: 60-300 units/L. 45-45 years old: 30-200 units/L. Adult: 30-120 units/L or 0.5-2.0 microkatals/L (SI units). ALT Child or adult: 4-36 international units/L at 98.67F (37C) or 4-36 units/L (SI units). AST Newborn 42-51 days old: 35-140 units/L. Child under 50 years old: 15-60 units/L. 57-78 years old: 15-50 units/L. 15-45 years old: 10-50 units/L. 3-67 years old: 10-40 units/L. Adult: 0-35 units/L or 0-0.58 microkatals/L (SI  units). Bilirubin Total bilirubin for newborn: 1.0-12.0 mg/dL or 32.3-557 micromoles/L (SI units). Adult, elderly, or child: Total bilirubin: 0.3-1.0 mg/dL or 3.2-20 micromoles/L (SI units). Indirect bilirubin: 0.2-0.8 mg/dL or 2.5-42.7 micromoles/L (SI units). Direct bilirubin: 0.1-0.3 mg/dL or 0.6-2.3 micromoles/L (SI units). Albumin Premature infant: 3.0-4.2 g/dL. Newborn: 3.5-5.4 g/dL. Infant: 4.4-5.4 g/dL. Child: 4.0-5.9 g/dL. Adult or elderly: 3.5-5.0 g/dL or 76-28 g/L (SI units). Total protein Premature infant: 4.2-7.6 g/dL. Newborn: 4.6-7.4 g/dL. Infant: 6.0-6.7 g/dL. Child: 6.2-8.0 g/dL. Adult or elderly: 6.4-8.3 g/dL or 31-51 g/L (SI units). What do the results mean? Diet and levels of activity can influence your test results. Sometimes they can be the cause of values that are outside of normal limits. However, sometimes values outside of normal limits can  indicate a medical condition. Possible results for this test are: Glucose Abnormally high glucose level (hyperglycemia). Possible causes of this include: Prediabetes mellitus. Diabetes mellitus. Severe stress caused by surgery, stroke, trauma. Overactive thyroid gland. Pancreatitis or pancreatic cancer. Abnormally low glucose level (hypoglycemia). Possible causes include: Underactive thyroid gland. Insulin-secreting tumors, also called insulinoma (rare). Creatinine Abnormally high creatinine level. Possible causes include: Kidney failure. Overactive thyroid (hyperthyroidism). Abnormal production of growth hormone in the body (acromegaly or gigantism). Abnormal breakdown of muscle tissue (rhabdomyolysis). Early muscular dystrophy. Abnormally low creatinine level. Possible causes include: Low muscle mass associated with malnutrition. Late-stage muscular dystrophy. BUN Abnormally high BUN level. This generally means that your kidneys are not functioning normally, especially if levels are greater than 50  mg/dL. Abnormally low BUN level. This may indicate malnutrition or liver failure. Potassium Abnormally high potassium level (hyperkalemia). Possible causes include: Kidney disease. Massive destruction of red blood cells (hemolysis). Adrenal gland failure (Addison's disease). Abnormally low potassium level (hypokalemia). Possible causes include: Excessive levels of the hormone aldosterone (hyperaldosteronism). Sodium Abnormally high sodium level (hypernatremia). This can be caused by: Dehydration. Urination due to low levels of antidiuretic hormone (diabetes insipidus). Hyperaldosteronism. Excessive levels of cortisol in the body (Cushing's syndrome). Abnormally low sodium level (hyponatremia). This can be caused by: Congestive heart failure. Cirrhosis of the liver. Kidney failure. Syndrome of inappropriate antidiuretic hormone (SIADH). Chloride Abnormally high chloride level (hyperchloremia). This can be caused by: Acute kidney failure. Diabetes insipidus. Prolonged diarrhea. Poisoning with aspirin or bromide. Abnormally low chloride level (hypochloremia). This can be caused by: Prolonged vomiting. Acute adrenal gland failure (Addisonian crisis). Hyperaldosteronism. SIADH. Calcium Abnormally high calcium level (hypercalcemia). Possible causes include: Excessive activity of the parathyroid glands (hyperparathyroidism). Certain cancers. Inflammation seen in sarcoidosis and tuberculosis. Abnormally low calcium level (hypocalcemia). Possible causes include: Underactive parathyroid glands (hypoparathyroidism). Vitamin D deficiency. Acute pancreatitis. Bicarbonate Abnormally high bicarbonate level. Possible causes include: Prolonged vomiting and diuretic therapy, which lead to a decrease in the amount of acid in the body (metabolic alkalosis). Conditions that increase the amount of bicarbonate in the body, including: Hyperaldosteronism. Rare hereditary disorders that interfere  with how your kidneys handle electrolytes, such as Bartter syndrome. Abnormally low bicarbonate level. Possible causes include: Conditions that cause your body to produce too much acid (metabolic acidosis). These include: Uncontrolled diabetes mellitus. Poisoning with aspirin, methanol, or antifreeze (ethylene glycol). ALP Abnormally high level of ALP. This can be a sign of: Certain cancers or tumors. Liver disease. Blocked bile duct. Sarcoidosis. Rickets. Bone problems such as a fracture. Abnormally low level of ALP. This can be caused by: Malnutrition. Hypophosphatasia. Wilson's disease. ALT Abnormally high level of ALT. This can indicate: Mononucleosis. Pancreatitis. Liver problems, such as hepatitis, cirrhosis, and liver cancer. AST Abnormally high level of AST. This can indicate: Liver problems, such as hepatitis, cirrhosis, and liver cancer. Mononucleosis. Pancreatitis. Muscle trauma. Bilirubin Abnormally high level of bilirubin. This may be caused by: Liver problems, such as cirrhosis, liver disease, and hepatitis. Problems with the bile ducts, pancreas, or gallbladder. Albumin Abnormally high albumin level. This may be caused by: Dehydration. Eating a high-protein diet. Abnormally low albumin level. This may be caused by: Eating a low-protein diet. Having had weight-loss surgery. Liver disease. Kidney disease. Crohn's disease. Total protein Abnormally high total protein level. This may be caused by: Infections, such as hepatitis B, hepatitis C, or HIV. Multiple myeloma. Waldenstrm's disease. Abnormally low total protein level. This may be caused by: Conditions such as malnutrition, severe burns, heavy  bleeding, or liver disease. Talk with your health care provider about what your results mean. Questions to ask your health care provider Ask your health care provider, or the department that is doing the test: When will my results be ready? How will I get my  results? What are my treatment options? What other tests do I need? What are my next steps? Summary Your health care provider may order the comprehensive metabolic panel (CMP) to check the levels of numerous substances in your blood. A blood sample is required for this test. It is usually collected by inserting a needle into a blood vessel. You may need to stop eating or drinking for a certain amount of time before your blood sample is taken. Sometimes values outside of normal limits can indicate a medical condition. This information is not intended to replace advice given to you by your health care provider. Make sure you discuss any questions you have with your health care provider. Document Revised: 11/14/2021 Document Reviewed: 11/02/2021 Elsevier Patient Education  2023 [employer]   Complete Blood Count Why am I having this test? A complete blood count (CBC) is a blood test that measures the cells of your blood. The types of cells are red blood cells (RBCs), white blood cells (WBCs), and platelets. Changes in these cells can be a sign of many conditions, such as anemia, infection, inflammation, bleeding, and blood-related cancer. You may have this test: As part of a routine physical exam. To help your health care provider diagnose certain health conditions. To help your health care provider watch known health conditions or treatments. What is being tested? A CBC measures your RBCs, WBCs, platelets, and their different parts. RBCs are made in the tissue inside your bones (bone marrow) and released into your blood. These cells contain a protein that carries oxygen in your blood (hemoglobin). CBC testing of RBCs includes: RBC count. This is the total number of RBCs. Hemoglobin (Hb). This is the total amount of hemoglobin. Hematocrit (Hct). This is the percentage of space that RBCs take up in your blood. Mean corpuscular volume (MCV). This is the average size of RBCs. Mean corpuscular  hemoglobin [facility]). This is the average amount of hemoglobin inside each RBC. Mean corpuscular hemoglobin concentration (MCHC). This is the average amount of hemoglobin contained (concentrated) inside each RBC. RBC distribution width (RDW). This may be included to measure the differences in the size of RBCs. WBCs are also made in your bone marrow. They are part of your body's disease-fighting system (immune system). CBC testing of WBCs includes: WBC count. This is the total number of WBCs. A count of the five types of WBCs: Neutrophils. Lymphocytes. Monocytes. Eosinophils. Basophils. Platelets are cell fragments that are important for blood clotting. A CBC measures: Total platelet count. Mean platelet volume (MPV). What kind of sample is taken?  A blood sample is required for this test. It is usually collected by inserting a needle into a blood vessel. How are the results reported? Your test results will be reported as values. Your health care provider will compare your results to normal ranges that were established after testing a large group of people (reference ranges). Reference ranges may vary among labs and hospitals. Reference ranges for a CBC usually apply only to people who are older than 18. For this test, common reference ranges for people older than 18 may be: Red blood cells RBC count Males: 4.7-6.1 Females: 4.2-5.4 Hb Males: 14-18 Females: 12-16 Hct Males: 42-52% Females: 37-47% MCV:  80-95 MCH: 27-31 MCHC: 32-36 RDW: 11-14.5% White blood cells WBC count: 5,000-10,000 Neutrophil count: 2,500-8,000 or 55-70% Lymphocyte count: 1,000-4,000 or 20-40% Monocyte count: 100-700 or 2-8% Eosinophil count: 50-500 or 1-4% Basophil count: 25-100 or 0.5-1% Platelets Platelet count: 150,000-400,000 MPV: 7.4-10.4 What do the results mean? Results that are outside the reference ranges may mean that you have an infection or other health condition. Red blood cells RBC count, Hb,  and Hct Results lower than the reference ranges may indicate anemia. Anemia may be caused by several conditions, such as a lack of iron (iron deficiency anemia). Results higher than the reference ranges may mean you have too many RBCs (polycythemia). This may be caused by several conditions, such as chronic obstructive pulmonary disease (COPD). MCV, MCH, MCHC, and RDW Results outside the reference ranges may indicate a condition that causes anemia. White blood cells WBC count Results lower than the reference range may indicate an infection or a condition that keeps your bone marrow from making new WBCs. Results higher than the reference range may indicate an infection, a condition that causes inflammation (autoimmune disease), or a blood-related cancer. Neutrophils, lymphocytes, and monocytes Results outside the reference ranges may indicate an infection, an autoimmune disease, or certain types of cancer. Eosinophils and basophils Results outside the reference ranges may indicate an allergy, an inflammatory condition such as asthma, or blood cell cancer. Platelets Platelet count Results lower than the reference range may indicate an infection or blood cell cancer. Results higher than the reference range may indicate certain types of anemia, an autoimmune disease, or certain cancers. MPV High or low results may indicate a problem with your bone marrow. Talk with your health care provider about what your results mean. Questions to ask your health care provider Ask your health care provider, or the department that is doing the test: When will my results be ready? How will I get my results? What are my treatment options? What other tests do I need? What are my next steps? This information is not intended to replace advice given to you by your health care provider. Make sure you discuss any questions you have with your health care provider. Document Revised: 02/20/2022 Document Reviewed:  02/20/2022 Elsevier Patient Education  2023 [employer].

## 2023-02-23 NOTE — Progress Notes (Signed)
@Patient  ID: Lacey Carrillo, female    DOB: 07-03-1986, 37 y.o.   MRN: [id number]  Chief Complaint  Patient presents with   Annual Exam    Referring provider: Ivonne Andrew, NP   HPI  Lacey Carrillo 37 y.o. female  has a past medical history of Heart palpitations, History of COVID-19 (2020), Hypertension, Uterine fibroid, and Wears glasses. To the [facility] for follow up visit.     Patient presents today for follow-up on hypertension and prediabetes. Patient states that she has seen a dietitian and is following diabetic and dash diet. Plans to start a walking routine. Denies f/c/s, n/v/d, hemoptysis, PND, leg swelling Denies chest pain or edema  Previous A1C was in prediabetic range. A1C was 5.6 today.     Allergies  Allergen Reactions   Seasonal Ic [Octacosanol]     Immunization History  Administered Date(s) Administered   Influenza,inj,Quad PF,6+ Mos 09/15/2020, 08/24/2021, 08/25/2022   Moderna Sars-Covid-2 Vaccination 12/22/2019   Tdap 09/15/2020   Unspecified SARS-COV-2 Vaccination 11/19/2019    Past Medical History:  Diagnosis Date   Heart palpitations    per pt followed by pcp, no work-up other than ekg done ,  approx 2014,  (09-12-2021 pt stated controlled w/ metoprolol )   History of COVID-19 2020   per pt mild symptoms that resolved   Hypertension    Uterine fibroid    Wears glasses     Tobacco History: Social History   Tobacco Use  Smoking Status Never  Smokeless Tobacco Never   Counseling given: Not Answered   Outpatient Encounter Medications as of 02/23/2023  Medication Sig   levonorgestrel (MIRENA) 20 MCG/DAY IUD 1 each by Intrauterine route once.   [DISCONTINUED] cetirizine (ZYRTEC) 10 MG tablet Take 1 tablet (10 mg total) by mouth at bedtime as needed.   [DISCONTINUED] ergocalciferol (VITAMIN D2) 1.25 MG (50000 UT) capsule Take 50,000 Units by mouth once a week.   [DISCONTINUED] metoprolol succinate (TOPROL-XL) 100 MG 24 hr tablet Take 1 tablet  (100 mg total) by mouth daily. Take with or immediately following a meal.   cetirizine (ZYRTEC) 10 MG tablet Take 1 tablet (10 mg total) by mouth at bedtime as needed.   CORICIDIN HBP 10-325-2 MG TABS Take by mouth. (Patient not taking: Reported on 02/23/2023)   ergocalciferol (VITAMIN D2) 1.25 MG (50000 UT) capsule Take 1 capsule (50,000 Units total) by mouth once a week.   metoprolol succinate (TOPROL-XL) 100 MG 24 hr tablet Take 1 tablet (100 mg total) by mouth daily. Take with or immediately following a meal.   No facility-administered encounter medications on file as of 02/23/2023.     Review of Systems  Review of Systems  Constitutional: Negative.   HENT: Negative.    Cardiovascular: Negative.   Gastrointestinal: Negative.   Allergic/Immunologic: Negative.   Neurological: Negative.   Psychiatric/Behavioral: Negative.         Physical Exam  BP (!) 145/98   Pulse 97   Temp 97.8 F (36.6 C)   Ht 5' (1.524 m)   Wt 177 lb (80.3 kg)   SpO2 99%   BMI 34.57 kg/m   Wt Readings from Last 5 Encounters:  02/23/23 177 lb (80.3 kg)  02/13/23 178 lb 9.6 oz (81 kg)  11/21/22 175 lb (79.4 kg)  10/18/22 179 lb 6.4 oz (81.4 kg)  08/25/22 176 lb (79.8 kg)     Physical Exam Vitals and nursing note reviewed.  Constitutional:      General: She  is not in acute distress.    Appearance: She is well-developed.  Cardiovascular:     Rate and Rhythm: Normal rate and regular rhythm.  Pulmonary:     Effort: Pulmonary effort is normal.     Breath sounds: Normal breath sounds.  Neurological:     Mental Status: She is alert and oriented to person, place, and time.      Lab Results:  CBC    Component Value Date/Time   WBC 7.2 02/24/2022 1655   WBC 15.4 (H) 09/20/2021 0218   RBC 4.87 02/24/2022 1655   RBC 3.66 (L) 09/20/2021 0218   HGB 13.4 02/24/2022 1655   HCT 41.6 02/24/2022 1655   PLT 284 02/24/2022 1655   MCV 85 02/24/2022 1655   MCH 27.5 02/24/2022 1655   MCH 27.9  09/20/2021 0218   MCHC 32.2 02/24/2022 1655   MCHC 31.6 09/20/2021 0218   RDW 12.2 02/24/2022 1655   LYMPHSABS 1.5 02/24/2022 1655   EOSABS 0.1 02/24/2022 1655   BASOSABS 0.0 02/24/2022 1655    BMET    Component Value Date/Time   NA 139 02/24/2022 1655   K 4.2 02/24/2022 1655   CL 101 02/24/2022 1655   CO2 24 02/24/2022 1655   GLUCOSE 123 (H) 02/24/2022 1655   GLUCOSE 153 (H) 09/20/2021 0218   BUN 13 02/24/2022 1655   CREATININE 0.68 02/24/2022 1655   CREATININE 0.68 02/22/2021 1645   CALCIUM 9.8 02/24/2022 1655   GFRNONAA >60 09/20/2021 0218   GFRNONAA 114 02/22/2021 1645   GFRAA 132 02/22/2021 1645      Assessment & Plan:   Prediabetes - POCT glycosylated hemoglobin (Hb A1C) - CBC - Comprehensive metabolic panel   2. Lipid screening  - Lipid Panel   3. Essential hypertension  - CBC - Comprehensive metabolic panel    Follow up:  Follow up in 6 months     Ivonne Andrew, NP 02/23/2023

## 2023-02-24 LAB — CBC
Hematocrit: 41.6 % (ref 34.0–46.6)
Hemoglobin: 13.1 g/dL (ref 11.1–15.9)
MCH: 26.8 pg (ref 26.6–33.0)
MCHC: 31.5 g/dL (ref 31.5–35.7)
MCV: 85 fL (ref 79–97)
Platelets: 221 10*3/uL (ref 150–450)
RBC: 4.88 x10E6/uL (ref 3.77–5.28)
RDW: 11.9 % (ref 11.7–15.4)
WBC: 6.7 10*3/uL (ref 3.4–10.8)

## 2023-02-24 LAB — LIPID PANEL
Chol/HDL Ratio: 2.7 ratio (ref 0.0–4.4)
Cholesterol, Total: 141 mg/dL (ref 100–199)
HDL: 52 mg/dL (ref >39)
LDL Chol Calc (NIH): 78 mg/dL (ref 0–99)
Triglycerides: 47 mg/dL (ref 0–149)
VLDL Cholesterol Cal: 11 mg/dL (ref 5–40)

## 2023-02-24 LAB — COMPREHENSIVE METABOLIC PANEL
ALT: 17 IU/L (ref 0–32)
AST: 15 IU/L (ref 0–40)
Albumin/Globulin Ratio: 1.3 (ref 1.2–2.2)
Albumin: 4 g/dL (ref 3.9–4.9)
Alkaline Phosphatase: 110 IU/L (ref 44–121)
BUN/Creatinine Ratio: 15 (ref 9–23)
BUN: 12 mg/dL (ref 6–20)
Bilirubin Total: 0.6 mg/dL (ref 0.0–1.2)
CO2: 21 mmol/L (ref 20–29)
Calcium: 9.4 mg/dL (ref 8.7–10.2)
Chloride: 106 mmol/L (ref 96–106)
Creatinine, Ser: 0.81 mg/dL (ref 0.57–1.00)
Globulin, Total: 3.1 g/dL (ref 1.5–4.5)
Glucose: 115 mg/dL — ABNORMAL HIGH (ref 70–99)
Potassium: 4.1 mmol/L (ref 3.5–5.2)
Sodium: 142 mmol/L (ref 134–144)
Total Protein: 7.1 g/dL (ref 6.0–8.5)
eGFR: 96 mL/min/{1.73_m2} (ref >59)

## 2023-02-24 LAB — VITAMIN D 25 HYDROXY (VIT D DEFICIENCY, FRACTURES): Vit D, 25-Hydroxy: 24.7 ng/mL — ABNORMAL LOW (ref 30.0–100.0)

## 2023-02-25 ENCOUNTER — Other Ambulatory Visit: Payer: Self-pay | Admitting: Nurse Practitioner

## 2023-02-25 MED ORDER — VITAMIN D (ERGOCALCIFEROL) 1.25 MG (50000 UNIT) PO CAPS: 50000.0000 [IU] | ORAL_CAPSULE | ORAL | 2 refills | Status: DC

## 2023-04-27 ENCOUNTER — Telehealth: Payer: Self-pay | Admitting: Nurse Practitioner

## 2023-04-27 NOTE — Telephone Encounter (Signed)
Pt said that she needs a referral for [facility] and that the info is in the notes on what's going she stated.

## 2023-05-04 NOTE — Telephone Encounter (Signed)
Called pt and inform message,pt has an apt in November, pt will call back if apt need it sooner. GH

## 2023-06-29 ENCOUNTER — Other Ambulatory Visit: Payer: Self-pay | Admitting: Pharmacist

## 2023-06-29 NOTE — Progress Notes (Signed)
Patient appearing on report for [employer] [facility] - Hypertension Control report due to last documented ambulatory blood pressure of 145/98 on 02/23/23. Next appointment with PCP is 08/31/23.   Outreached patient to discuss hypertension control and medication management. Patient dropped the call.  Adam Phenix, PharmD PGY-1 Pharmacy Resident

## 2023-08-13 DIAGNOSIS — Z124 Encounter for screening for malignant neoplasm of cervix: Secondary | ICD-10-CM | POA: Diagnosis not present

## 2023-08-13 DIAGNOSIS — Z01419 Encounter for gynecological examination (general) (routine) without abnormal findings: Secondary | ICD-10-CM | POA: Diagnosis not present

## 2023-08-13 DIAGNOSIS — Z6834 Body mass index (BMI) 34.0-34.9, adult: Secondary | ICD-10-CM | POA: Diagnosis not present

## 2023-08-13 DIAGNOSIS — Z1151 Encounter for screening for human papillomavirus (HPV): Secondary | ICD-10-CM | POA: Diagnosis not present

## 2023-08-31 ENCOUNTER — Ambulatory Visit: Payer: Self-pay | Admitting: Nurse Practitioner

## 2023-09-16 ENCOUNTER — Other Ambulatory Visit: Payer: Self-pay | Admitting: Nurse Practitioner

## 2023-09-16 DIAGNOSIS — I10 Essential (primary) hypertension: Secondary | ICD-10-CM

## 2023-10-18 DIAGNOSIS — Z3202 Encounter for pregnancy test, result negative: Secondary | ICD-10-CM | POA: Diagnosis not present

## 2023-10-18 DIAGNOSIS — Z30433 Encounter for removal and reinsertion of intrauterine contraceptive device: Secondary | ICD-10-CM | POA: Diagnosis not present

## 2023-10-19 ENCOUNTER — Ambulatory Visit (INDEPENDENT_AMBULATORY_CARE_PROVIDER_SITE_OTHER): Payer: 59 | Admitting: Nurse Practitioner

## 2023-10-19 ENCOUNTER — Encounter: Payer: Self-pay | Admitting: Nurse Practitioner

## 2023-10-19 VITALS — BP 149/97 | HR 95 | Temp 97.4°F | Wt 181.4 lb

## 2023-10-19 DIAGNOSIS — Z23 Encounter for immunization: Secondary | ICD-10-CM

## 2023-10-19 DIAGNOSIS — M65331 Trigger finger, right middle finger: Secondary | ICD-10-CM | POA: Diagnosis not present

## 2023-10-19 DIAGNOSIS — R7303 Prediabetes: Secondary | ICD-10-CM | POA: Diagnosis not present

## 2023-10-19 DIAGNOSIS — E66812 Obesity, class 2: Secondary | ICD-10-CM

## 2023-10-19 DIAGNOSIS — Z6835 Body mass index (BMI) 35.0-35.9, adult: Secondary | ICD-10-CM

## 2023-10-19 DIAGNOSIS — I10 Essential (primary) hypertension: Secondary | ICD-10-CM | POA: Diagnosis not present

## 2023-10-19 LAB — POCT GLYCOSYLATED HEMOGLOBIN (HGB A1C): Hemoglobin A1C: 6.1 % — AB (ref 4.0–5.6)

## 2023-10-19 MED ORDER — AMLODIPINE BESYLATE 5 MG PO TABS
5.0000 mg | ORAL_TABLET | Freq: Every day | ORAL | 0 refills | Status: DC
Start: 2023-10-19 — End: 2024-01-20

## 2023-10-19 NOTE — Progress Notes (Signed)
Subjective   Patient ID: Lacey Carrillo, female    DOB: 1985/11/16, 37 y.o.   MRN: [id number]  Chief Complaint  Patient presents with   Hypertension    Follow up    Referring provider: Ivonne Andrew, NP  Lacey Carrillo is a 37 y.o. female with Past Medical History: No date: Heart palpitations     Comment:  per pt followed by pcp, no work-up other than ekg done ,              approx 2014,  (09-12-2021 pt stated controlled w/               metoprolol ) 2020: History of COVID-19     Comment:  per pt mild symptoms that resolved No date: Hypertension No date: Uterine fibroid No date: Wears glasses   HPI  Patient presents today for follow-up on hypertension and prediabetes. Patient states that she has seen a dietitian and is following diabetic and dash diet. Plans to start a walking routine. Denies f/c/s, n/v/d, hemoptysis, PND, leg swelling Denies chest pain or edema.    Did not take BP meds today. Has noticed BP is elevated even when taking metoprolol. Will restart amlodipine. Patient would like referral to weight and wellness.   Complains of right middle finger that is stiff and will not completely bend. Cannot make a fist. Will refer to ortho for treatment of trigger finger.    Previous A1C was in prediabetic range. A1C was 6.1 today.   Allergies  Allergen Reactions   Seasonal Ic [Octacosanol]     Immunization History  Administered Date(s) Administered   Influenza,inj,Quad PF,6+ Mos 09/15/2020, 08/24/2021, 08/25/2022   Moderna Sars-Covid-2 Vaccination 12/22/2019   Tdap 09/15/2020   Unspecified SARS-COV-2 Vaccination 11/19/2019    Tobacco History: Social History   Tobacco Use  Smoking Status Never  Smokeless Tobacco Never   Counseling given: Not Answered   Outpatient Encounter Medications as of 10/19/2023  Medication Sig   amLODipine (NORVASC) 5 MG tablet Take 1 tablet (5 mg total) by mouth daily.   ergocalciferol (VITAMIN D2) 1.25 MG (50000 UT) capsule  Take 1 capsule (50,000 Units total) by mouth once a week.   levonorgestrel (MIRENA) 20 MCG/DAY IUD 1 each by Intrauterine route once.   metoprolol succinate (TOPROL-XL) 100 MG 24 hr tablet TAKE 1 TABLET BY MOUTH DAILY. TAKE WITH OR IMMEDIATELY FOLLOWING A MEAL.   cetirizine (ZYRTEC) 10 MG tablet Take 1 tablet (10 mg total) by mouth at bedtime as needed. (Patient not taking: Reported on 10/19/2023)   CORICIDIN HBP 10-325-2 MG TABS Take by mouth. (Patient not taking: Reported on 02/23/2023)   No facility-administered encounter medications on file as of 10/19/2023.    Review of Systems  Review of Systems  Constitutional: Negative.   HENT: Negative.    Cardiovascular: Negative.   Gastrointestinal: Negative.   Allergic/Immunologic: Negative.   Neurological: Negative.   Psychiatric/Behavioral: Negative.       Objective:   BP (!) 149/97   Pulse 95   Temp (!) 97.4 F (36.3 C)   Wt 181 lb 6.4 oz (82.3 kg)   SpO2 100%   BMI 35.43 kg/m   Wt Readings from Last 5 Encounters:  10/19/23 181 lb 6.4 oz (82.3 kg)  02/23/23 177 lb (80.3 kg)  02/13/23 178 lb 9.6 oz (81 kg)  11/21/22 175 lb (79.4 kg)  10/18/22 179 lb 6.4 oz (81.4 kg)     Physical Exam Vitals and nursing note reviewed.  Constitutional:      General: She is not in acute distress.    Appearance: She is well-developed.  Cardiovascular:     Rate and Rhythm: Normal rate and regular rhythm.  Pulmonary:     Effort: Pulmonary effort is normal.     Breath sounds: Normal breath sounds.  Musculoskeletal:     Comments: Right middle finger with decreased ROM noted consistent with trigger finger  Neurological:     Mental Status: She is alert and oriented to person, place, and time.       Assessment & Plan:   Need for influenza vaccination -     Flu vaccine trivalent PF, 6mos and older(Flulaval,Afluria,Fluarix,Fluzone)  Prediabetes -     POCT glycosylated hemoglobin (Hb A1C)  Trigger middle finger of right hand -      Ambulatory referral to Orthopedics  Essential hypertension -     amLODIPine Besylate; Take 1 tablet (5 mg total) by mouth daily.  Dispense: 90 tablet; Refill: 0 -     CBC -     Comprehensive metabolic panel  Class 2 severe obesity due to excess calories with serious comorbidity and body mass index (BMI) of 35.0 to 35.9 in adult [facility]) -     Amb Ref to Medical Weight Management     Return in about 6 months (around 04/18/2024).   Ivonne Andrew, NP 10/19/2023

## 2023-10-19 NOTE — Patient Instructions (Signed)
1. Need for influenza vaccination (Primary)  - Flu vaccine trivalent PF, 6mos and older(Flulaval,Afluria,Fluarix,Fluzone)  2. Prediabetes  - POCT glycosylated hemoglobin (Hb A1C)  3. Trigger middle finger of right hand  - AMB referral to orthopedics  4. Essential hypertension  - amLODipine (NORVASC) 5 MG tablet; Take 1 tablet (5 mg total) by mouth daily.  Dispense: 90 tablet; Refill: 0  5. Class 2 severe obesity due to excess calories with serious comorbidity and body mass index (BMI) of 35.0 to 35.9 in adult [facility])  - Amb Ref to Medical Weight Management

## 2023-10-20 LAB — COMPREHENSIVE METABOLIC PANEL
ALT: 14 [IU]/L (ref 0–32)
AST: 20 [IU]/L (ref 0–40)
Albumin: 3.8 g/dL — ABNORMAL LOW (ref 3.9–4.9)
Alkaline Phosphatase: 120 [IU]/L (ref 44–121)
BUN/Creatinine Ratio: 17 (ref 9–23)
BUN: 12 mg/dL (ref 6–20)
Bilirubin Total: 0.4 mg/dL (ref 0.0–1.2)
CO2: 21 mmol/L (ref 20–29)
Calcium: 9.6 mg/dL (ref 8.7–10.2)
Chloride: 103 mmol/L (ref 96–106)
Creatinine, Ser: 0.69 mg/dL (ref 0.57–1.00)
Globulin, Total: 2.9 g/dL (ref 1.5–4.5)
Glucose: 138 mg/dL — ABNORMAL HIGH (ref 70–99)
Potassium: 4.6 mmol/L (ref 3.5–5.2)
Sodium: 138 mmol/L (ref 134–144)
Total Protein: 6.7 g/dL (ref 6.0–8.5)
eGFR: 115 mL/min/{1.73_m2} (ref >59)

## 2023-10-20 LAB — CBC
Hematocrit: 39.8 % (ref 34.0–46.6)
Hemoglobin: 12.6 g/dL (ref 11.1–15.9)
MCH: 27.7 pg (ref 26.6–33.0)
MCHC: 31.7 g/dL (ref 31.5–35.7)
MCV: 88 fL (ref 79–97)
Platelets: 235 10*3/uL (ref 150–450)
RBC: 4.55 x10E6/uL (ref 3.77–5.28)
RDW: 12 % (ref 11.7–15.4)
WBC: 7.1 10*3/uL (ref 3.4–10.8)

## 2023-11-05 NOTE — Progress Notes (Signed)
 Office Visit Note   Patient: Lacey Carrillo           Date of Birth: 10/24/1985           MRN: [id number] Visit Date: 11/06/2023              Requested by: Oley Bascom RAMAN, NP [phone] [address] [place],  [place] [address] PCP: Oley Bascom RAMAN, NP   Assessment & Plan: Visit Diagnoses:  1. Trigger finger, right middle finger     Plan: Impression is 38 year old female with a right middle trigger finger.  Treatment options explained and she agreed to proceed with a cortisone injection.  She tolerates well.  Follow-up as needed.  Follow-Up Instructions: No follow-ups on file.   Orders:  No orders of the defined types were placed in this encounter.  No orders of the defined types were placed in this encounter.     Procedures: Hand/UE Inj: R long A1 for trigger finger on 11/06/2023 7:22 AM Indications: pain Details: 25 G needle Medications: 0.3 mL lidocaine  1 %; 0.33 mL bupivacaine  0.5 %; 13.33 mg methylPREDNISolone  acetate 40 MG/ML Outcome: tolerated well, no immediate complications Consent was given by the patient. Patient was prepped and draped in the usual sterile fashion.       Clinical Data: No additional findings.   Subjective: Chief Complaint  Patient presents with   Right Hand - Pain    HPI Tamaiya comes in for evaluation of a years worth of stiffness and catching of the right long finger.  Symptoms are worse in the morning and progressively gets better as the day goes on.  Occasionally does report that the finger locks. Review of Systems  Constitutional: Negative.   HENT: Negative.    Eyes: Negative.   Respiratory: Negative.    Cardiovascular: Negative.   Endocrine: Negative.   Musculoskeletal: Negative.   Neurological: Negative.   Hematological: Negative.   Psychiatric/Behavioral: Negative.    All other systems reviewed and are negative.    Objective: Vital Signs: There were no vitals taken for this visit.  Physical Exam Vitals and nursing  note reviewed.  Constitutional:      Appearance: She is well-developed.  HENT:     Head: Atraumatic.     Nose: Nose normal.  Eyes:     Extraocular Movements: Extraocular movements intact.  Cardiovascular:     Pulses: Normal pulses.  Pulmonary:     Effort: Pulmonary effort is normal.  Abdominal:     Palpations: Abdomen is soft.  Musculoskeletal:     Cervical back: Neck supple.  Skin:    General: Skin is warm.     Capillary Refill: Capillary refill takes less than 2 seconds.  Neurological:     Mental Status: She is alert. Mental status is at baseline.  Psychiatric:        Behavior: Behavior normal.        Thought Content: Thought content normal.        Judgment: Judgment normal.    Ortho Exam Examination of the right hand shows tenderness at the A1 pulley of the long finger.  No palpable nodules.  No reproducible catching or locking. Specialty Comments:  No specialty comments available.  Imaging: No results found.   PMFS History: Patient Active Problem List   Diagnosis Date Noted   Obstructive sleep apnea 10/18/2022   Heart palpitations 08/25/2022   Fibroid uterus 09/19/2021   Prediabetes 08/25/2021   Uterine leiomyoma 02/11/2020   Essential hypertension  08/12/2019   Past Medical History:  Diagnosis Date   Heart palpitations    per pt followed by pcp, no work-up other than ekg done ,  approx 2014,  (09-12-2021 pt stated controlled w/ metoprolol  )   History of COVID-19 2020   per pt mild symptoms that resolved   Hypertension    Uterine fibroid    Wears glasses     Family History  Problem Relation Age of Onset   Diabetes Mother    Diabetes Father     Past Surgical History:  Procedure Laterality Date   MYOMECTOMY N/A 09/19/2021   Procedure: ABDOMINAL MYOMECTOMY;  Surgeon: Dannielle Bouchard, DO;  Location: [facility];  Service: Gynecology;  Laterality: N/A;   NO PAST SURGERIES     WISDOM TOOTH EXTRACTION     Social History   Occupational  History   Not on file  Tobacco Use   Smoking status: Never   Smokeless tobacco: Never  Vaping Use   Vaping status: Never Used  Substance and Sexual Activity   Alcohol use: Not Currently   Drug use: Never   Sexual activity: Yes    Birth control/protection: I.U.D.

## 2023-11-06 ENCOUNTER — Ambulatory Visit: Payer: No Typology Code available for payment source | Admitting: Orthopaedic Surgery

## 2023-11-06 DIAGNOSIS — M65331 Trigger finger, right middle finger: Secondary | ICD-10-CM | POA: Diagnosis not present

## 2023-11-06 MED ORDER — BUPIVACAINE HCL 0.5 % IJ SOLN
0.3300 mL | INTRAMUSCULAR | Status: AC | PRN
  Administered 2023-11-06: .33 mL

## 2023-11-06 MED ORDER — METHYLPREDNISOLONE ACETATE 40 MG/ML IJ SUSP
13.3300 mg | INTRAMUSCULAR | Status: AC | PRN
  Administered 2023-11-06: 13.33 mg

## 2023-11-06 MED ORDER — LIDOCAINE HCL 1 % IJ SOLN
0.3000 mL | INTRAMUSCULAR | Status: AC | PRN
  Administered 2023-11-06: .3 mL

## 2023-12-19 ENCOUNTER — Ambulatory Visit (INDEPENDENT_AMBULATORY_CARE_PROVIDER_SITE_OTHER): Payer: No Typology Code available for payment source | Admitting: Adult Health

## 2023-12-19 ENCOUNTER — Encounter (INDEPENDENT_AMBULATORY_CARE_PROVIDER_SITE_OTHER): Payer: Self-pay | Admitting: Adult Health

## 2023-12-19 VITALS — BP 125/83 | HR 87 | Temp 97.9°F | Ht 60.5 in | Wt 178.0 lb

## 2023-12-19 DIAGNOSIS — E559 Vitamin D deficiency, unspecified: Secondary | ICD-10-CM

## 2023-12-19 DIAGNOSIS — E669 Obesity, unspecified: Secondary | ICD-10-CM | POA: Diagnosis not present

## 2023-12-19 DIAGNOSIS — I10 Essential (primary) hypertension: Secondary | ICD-10-CM | POA: Diagnosis not present

## 2023-12-19 DIAGNOSIS — Z0000 Encounter for general adult medical examination without abnormal findings: Secondary | ICD-10-CM

## 2023-12-19 DIAGNOSIS — R7303 Prediabetes: Secondary | ICD-10-CM

## 2023-12-19 DIAGNOSIS — Z6834 Body mass index (BMI) 34.0-34.9, adult: Secondary | ICD-10-CM

## 2023-12-19 NOTE — Progress Notes (Signed)
 Office: [phone]  /  Fax: [phone]   Initial Visit  Lacey Carrillo was seen in clinic today to evaluate for obesity. She is interested in losing weight to improve overall health and reduce the risk of weight related complications. She presents today to review program treatment options, initial physical assessment, and evaluation.     She was referred by: PCP  When asked what else they would like to accomplish? She states: Adopt healthier eating patterns, Improve energy levels and physical activity, Improve existing medical conditions, Reduce number of medications, Improve quality of life, and Improve appearance  Weight history: Weight gain the last 10 years (since late 20s).  When asked how has your weight affected you? She states: Contributed to medical problems, Having fatigue, Having poor endurance, and Problems with eating patterns  Some associated conditions: Hypertension, Prediabetes, and Vitamin D Deficiency  Contributing factors: Family history of obesity, Disruption of circadian rhythm / sleep disordered breathing, Consumption of processed foods, Reduced physical activity, and Eating patterns  Weight promoting medications identified: Beta-blockers and Contraceptives or hormonal therapy  Current nutrition plan: None  Current level of physical activity: None  Current or previous pharmacotherapy: None  Response to medication: Never tried medications   Past medical history includes:   Past Medical History:  Diagnosis Date   Heart palpitations    per pt followed by pcp, no work-up other than ekg done ,  approx 2014,  (09-12-2021 pt stated controlled w/ metoprolol )   History of COVID-19 2020   per pt mild symptoms that resolved   Hypertension    Uterine fibroid    Wears glasses      Objective:   BP 125/83   Pulse 87   Temp 97.9 F (36.6 C)   Ht 5' 0.5" (1.537 m)   Wt 178 lb (80.7 kg)   SpO2 97%   BMI 34.19 kg/m  She was weighed on the bioimpedance  scale: Body mass index is 34.19 kg/m.  Peak Weight:188 , Body Fat%:42.3, Visceral Fat Rating:9, Weight trend over the last 12 months: Increasing  General:  Alert, oriented and cooperative. Patient is in no acute distress.  Respiratory: Normal respiratory effort, no problems with respiration noted   Gait: able to ambulate independently  Mental Status: Normal mood and affect. Normal behavior. Normal judgment and thought content.   DIAGNOSTIC DATA REVIEWED:  BMET    Component Value Date/Time   NA 138 10/19/2023 1033   K 4.6 10/19/2023 1033   CL 103 10/19/2023 1033   CO2 21 10/19/2023 1033   GLUCOSE 138 (H) 10/19/2023 1033   GLUCOSE 153 (H) 09/20/2021 0218   BUN 12 10/19/2023 1033   CREATININE 0.69 10/19/2023 1033   CREATININE 0.68 02/22/2021 1645   CALCIUM 9.6 10/19/2023 1033   GFRNONAA >60 09/20/2021 0218   GFRNONAA 114 02/22/2021 1645   GFRAA 132 02/22/2021 1645   Lab Results  Component Value Date   HGBA1C 6.1 (A) 10/19/2023   HGBA1C 6.4 (A) 09/15/2020   No results found for: "INSULIN" CBC    Component Value Date/Time   WBC 7.1 10/19/2023 1033   WBC 15.4 (H) 09/20/2021 0218   RBC 4.55 10/19/2023 1033   RBC 3.66 (L) 09/20/2021 0218   HGB 12.6 10/19/2023 1033   HCT 39.8 10/19/2023 1033   PLT 235 10/19/2023 1033   MCV 88 10/19/2023 1033   MCH 27.7 10/19/2023 1033   MCH 27.9 09/20/2021 0218   MCHC 31.7 10/19/2023 1033   MCHC 31.6 09/20/2021 0218  RDW 12.0 10/19/2023 1033   Iron/TIBC/Ferritin/ %Sat    Component Value Date/Time   IRON 55 09/15/2020 0923   TIBC 286 09/15/2020 0923   FERRITIN 166 (H) 09/15/2020 0923   IRONPCTSAT 19 09/15/2020 0923   Lipid Panel     Component Value Date/Time   CHOL 141 02/23/2023 0853   TRIG 47 02/23/2023 0853   HDL 52 02/23/2023 0853   CHOLHDL 2.7 02/23/2023 0853   CHOLHDL 2.5 02/22/2021 1645   LDLCALC 78 02/23/2023 0853   LDLCALC 75 02/22/2021 1645   Hepatic Function Panel     Component Value Date/Time   PROT 6.7  10/19/2023 1033   ALBUMIN 3.8 (L) 10/19/2023 1033   AST 20 10/19/2023 1033   ALT 14 10/19/2023 1033   ALKPHOS 120 10/19/2023 1033   BILITOT 0.4 10/19/2023 1033      Component Value Date/Time   TSH 0.78 02/22/2021 1645     Assessment and Plan:   Healthcare maintenance  Vitamin D deficiency  Prediabetes  Essential hypertension  ESTABLISH WITH HWW   Obesity Treatment / Action Plan:  Patient will work on garnering support from family and friends to begin weight loss journey. Will work on eliminating or reducing the presence of highly palatable, calorie dense foods in the home. Will complete provided nutritional and psychosocial assessment questionnaire before the next appointment. Will be scheduled for indirect calorimetry to determine resting energy expenditure in a fasting state.  This will allow [place] to create a reduced calorie, high-protein meal plan to promote loss of fat mass while preserving muscle mass. Counseled on the health benefits of losing 5%-15% of total body weight. Was counseled on nutritional approaches to weight loss and benefits of reducing processed foods and consuming plant-based foods and high quality protein as part of nutritional weight management. Was counseled on pharmacotherapy and role as an adjunct in weight management.   Obesity Education Performed Today:  She was weighed on the bioimpedance scale and results were discussed and documented in the synopsis.  We discussed obesity as a disease and the importance of a more detailed evaluation of all the factors contributing to the disease.  We discussed the importance of long term lifestyle changes which include nutrition, exercise and behavioral modifications as well as the importance of customizing this to her specific health and social needs.  We discussed the benefits of reaching a healthier weight to alleviate the symptoms of existing conditions and reduce the risks of the biomechanical, metabolic  and psychological effects of obesity.  Lacey Carrillo appears to be in the action stage of change and states they are ready to start intensive lifestyle modifications and behavioral modifications.  30 minutes was spent today on this visit including the above counseling, pre-visit chart review, and post-visit documentation.  Reviewed by clinician on day of visit: allergies, medications, problem list, medical history, surgical history, family history, social history, and previous encounter notes pertinent to obesity diagnosis.  Yamina Lenis d. Adine Heimann, NP-C

## 2024-01-19 ENCOUNTER — Other Ambulatory Visit: Payer: Self-pay | Admitting: Nurse Practitioner

## 2024-01-19 DIAGNOSIS — I10 Essential (primary) hypertension: Secondary | ICD-10-CM

## 2024-02-03 ENCOUNTER — Other Ambulatory Visit: Payer: Self-pay | Admitting: Nurse Practitioner

## 2024-02-16 ENCOUNTER — Other Ambulatory Visit: Payer: Self-pay | Admitting: Nurse Practitioner

## 2024-02-16 DIAGNOSIS — I10 Essential (primary) hypertension: Secondary | ICD-10-CM

## 2024-04-05 ENCOUNTER — Other Ambulatory Visit: Payer: Self-pay | Admitting: Nurse Practitioner

## 2024-04-05 DIAGNOSIS — J302 Other seasonal allergic rhinitis: Secondary | ICD-10-CM

## 2024-04-05 DIAGNOSIS — R0981 Nasal congestion: Secondary | ICD-10-CM

## 2024-04-15 ENCOUNTER — Telehealth: Payer: Self-pay

## 2024-04-15 NOTE — Telephone Encounter (Signed)
 Copied from CRM [id number]. Topic: Clinical - Medication Question >> Apr 15, 2024  1:05 PM Winona R wrote: Pt calling to find out if her amLODipine  (NORVASC ) 5 MG tablet can be filled for a temp supply as she needs to reschedule her appointment and the next avail appointment isn't until late July. Pt has every intension to make the next appointment. Please feel free to communicate through mychart.

## 2024-04-16 ENCOUNTER — Other Ambulatory Visit: Payer: Self-pay | Admitting: Nurse Practitioner

## 2024-04-16 DIAGNOSIS — I10 Essential (primary) hypertension: Secondary | ICD-10-CM

## 2024-04-16 MED ORDER — AMLODIPINE BESYLATE 5 MG PO TABS: 5.0000 mg | ORAL_TABLET | Freq: Every day | ORAL | 0 refills | Status: DC

## 2024-04-18 ENCOUNTER — Ambulatory Visit: Payer: Self-pay | Admitting: Nurse Practitioner

## 2024-04-29 ENCOUNTER — Ambulatory Visit: Admitting: Orthopaedic Surgery

## 2024-05-06 ENCOUNTER — Ambulatory Visit: Admitting: Orthopaedic Surgery

## 2024-05-06 ENCOUNTER — Encounter: Payer: Self-pay | Admitting: Orthopaedic Surgery

## 2024-05-06 DIAGNOSIS — M65331 Trigger finger, right middle finger: Secondary | ICD-10-CM | POA: Diagnosis not present

## 2024-05-06 NOTE — Progress Notes (Signed)
 Office Visit Note   Patient: Lacey Carrillo           Date of Birth: 26-Aug-1986           MRN: [id number] Visit Date: 05/06/2024              Requested by: Oley Bascom RAMAN, NP [phone] [address] [place],  [place] [address] PCP: Oley Bascom RAMAN, NP   Assessment & Plan: Visit Diagnoses:  1. Trigger finger, right middle finger     Plan: History of Present Illness The patient presents with recurrence of right middle trigger finger symptoms.  Symptoms in the right middle trigger finger have recurred after an injection in January, which provided relief for five to six months. Symptoms began to return in June. Current symptoms include stiffness and weakness in the right hand, with the finger feeling 'too tight' to pop out of place. 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 They also work a part-time job that involves lifting, which they believe they can manage around the recovery period. They are concerned about the long-term implications of the condition, especially as they approach the age of 62.  Exam of the right middle finger is unchanged from prior visit.  Assessment and Plan Trigger finger, right middle finger Chronic trigger finger with symptom recurrence post-cortisone injection. Considering surgery due to impact on dominant hand and work. - Discussed second cortisone injection versus surgical release. - Explained surgical procedure - Recovery: 3-4 weeks soreness and weakness, function maintained. - Surgery expected to eliminate symptoms improved by cortisone. - Alternatives: another injection or living with condition, latter not preferred due to work. - Consider surgical release during December break. - Provided surgery scheduler's contact for scheduling.  Follow-Up Instructions: No follow-ups on file.   Orders:  No orders of the defined types were placed in this encounter.  No orders of  the defined types were placed in this encounter.     Procedures: No procedures performed   Clinical Data: No additional findings.   Subjective: Chief Complaint  Patient presents with   Right Hand - Pain    Middle finger    HPI  Review of Systems  Constitutional: Negative.   HENT: Negative.    Eyes: Negative.   Respiratory: Negative.    Cardiovascular: Negative.   Endocrine: Negative.   Musculoskeletal: Negative.   Neurological: Negative.   Hematological: Negative.   Psychiatric/Behavioral: Negative.    All other systems reviewed and are negative.    Objective: Vital Signs: There were no vitals taken for this visit.  Physical Exam Vitals and nursing note reviewed.  Constitutional:      Appearance: She is well-developed.  HENT:     Head: Atraumatic.     Nose: Nose normal.  Eyes:     Extraocular Movements: Extraocular movements intact.  Cardiovascular:     Pulses: Normal pulses.  Pulmonary:     Effort: Pulmonary effort is normal.  Abdominal:     Palpations: Abdomen is soft.  Musculoskeletal:     Cervical back: Neck supple.  Skin:    General: Skin is warm.     Capillary Refill: Capillary refill takes less than 2 seconds.  Neurological:     Mental Status: She is alert. Mental status is at baseline.  Psychiatric:        Behavior: Behavior normal.        Thought Content: Thought content normal.  Judgment: Judgment normal.     Ortho Exam  Specialty Comments:  No specialty comments available.  Imaging: No results found.   PMFS History: Patient Active Problem List   Diagnosis Date Noted   Obstructive sleep apnea 10/18/2022   Heart palpitations 08/25/2022   Fibroid uterus 09/19/2021   Prediabetes 08/25/2021   Uterine leiomyoma 02/11/2020   Essential hypertension 08/12/2019   Past Medical History:  Diagnosis Date   Heart palpitations    per pt followed by pcp, no work-up other than ekg done ,  approx 2014,  (09-12-2021 pt stated  controlled w/ metoprolol  )   History of COVID-19 2020   per pt mild symptoms that resolved   Hypertension    Uterine fibroid    Wears glasses     Family History  Problem Relation Age of Onset   Diabetes Mother    Diabetes Father     Past Surgical History:  Procedure Laterality Date   MYOMECTOMY N/A 09/19/2021   Procedure: ABDOMINAL MYOMECTOMY;  Surgeon: Dannielle Bouchard, DO;  Location: [facility];  Service: Gynecology;  Laterality: N/A;   NO PAST SURGERIES     WISDOM TOOTH EXTRACTION     Social History   Occupational History   Not on file  Tobacco Use   Smoking status: Never   Smokeless tobacco: Never  Vaping Use   Vaping status: Never Used  Substance and Sexual Activity   Alcohol use: Not Currently   Drug use: Never   Sexual activity: Yes    Birth control/protection: I.U.D.

## 2024-05-23 ENCOUNTER — Encounter: Payer: Self-pay | Admitting: Nurse Practitioner

## 2024-05-23 ENCOUNTER — Ambulatory Visit (INDEPENDENT_AMBULATORY_CARE_PROVIDER_SITE_OTHER): Payer: Self-pay | Admitting: Nurse Practitioner

## 2024-05-23 VITALS — BP 120/81 | HR 98 | Temp 98.4°F | Wt 184.0 lb

## 2024-05-23 DIAGNOSIS — Z13220 Encounter for screening for lipoid disorders: Secondary | ICD-10-CM | POA: Diagnosis not present

## 2024-05-23 DIAGNOSIS — R7303 Prediabetes: Secondary | ICD-10-CM

## 2024-05-23 DIAGNOSIS — I10 Essential (primary) hypertension: Secondary | ICD-10-CM

## 2024-05-23 NOTE — Patient Instructions (Signed)

## 2024-05-23 NOTE — Progress Notes (Signed)
 Subjective   Patient ID: Lacey Carrillo, female    DOB: [birth year], 38 y.o.   MRN: [id number]  Chief Complaint  Patient presents with   Medical Management of Chronic Issues    Follow up     Referring provider: Oley Bascom RAMAN, NP  Hermie Reagor is a 38 y.o. female with Past Medical History: No date: Arrhythmia No date: Heart palpitations     Comment:  per pt followed by pcp, no work-up other than ekg done ,              approx 2014,  (09-12-2021 pt stated controlled w/               metoprolol  ) 2020: History of COVID-19     Comment:  per pt mild symptoms that resolved No date: Hypertension No date: Uterine fibroid No date: Wears glasses   HPI  Patient presents today for follow-up on hypertension and prediabetes. Patient states that she has seen medical weight management. and is following diabetic and dash diet. Denies f/c/s, n/v/d, hemoptysis, PND, leg swelling Denies chest pain or edema.     Allergies  Allergen Reactions   Seasonal Ic [Octacosanol]     Immunization History  Administered Date(s) Administered   Influenza, Seasonal, Injecte, Preservative Fre 10/19/2023   Influenza,inj,Quad PF,6+ Mos 09/15/2020, 08/24/2021, 08/25/2022   Moderna Sars-Covid-2 Vaccination 12/22/2019   Tdap 09/15/2020   Unspecified SARS-COV-2 Vaccination 11/19/2019    Tobacco History: Social History   Tobacco Use  Smoking Status Never  Smokeless Tobacco Never   Counseling given: Not Answered   Outpatient Encounter Medications as of 05/23/2024  Medication Sig   amLODipine  (NORVASC ) 5 MG tablet Take 1 tablet (5 mg total) by mouth daily.   cetirizine  (ZYRTEC ) 10 MG tablet TAKE 1 TABLET BY MOUTH AT BEDTIME AS NEEDED.   CORICIDIN HBP 10-325-2 MG TABS Take by mouth.   levonorgestrel  (MIRENA ) 20 MCG/DAY IUD 1 each by Intrauterine route once.   metoprolol  succinate (TOPROL -XL) 100 MG 24 hr tablet TAKE 1 TABLET BY MOUTH DAILY. TAKE WITH OR IMMEDIATELY FOLLOWING A MEAL.   Vitamin D ,  Ergocalciferol , (DRISDOL ) 1.25 MG (50000 UNIT) CAPS capsule TAKE 1 CAPSULE (50,000 UNITS TOTAL) BY MOUTH EVERY 7 (SEVEN) DAYS   No facility-administered encounter medications on file as of 05/23/2024.    Review of Systems  Review of Systems  Constitutional: Negative.   HENT: Negative.    Cardiovascular: Negative.   Gastrointestinal: Negative.   Allergic/Immunologic: Negative.   Neurological: Negative.   Psychiatric/Behavioral: Negative.       Objective:   BP 120/81   Pulse 98   Temp 98.4 F (36.9 C) (Oral)   Wt 184 lb (83.5 kg)   SpO2 99%   BMI 35.34 kg/m   Wt Readings from Last 5 Encounters:  05/23/24 184 lb (83.5 kg)  12/19/23 178 lb (80.7 kg)  10/19/23 181 lb 6.4 oz (82.3 kg)  02/23/23 177 lb (80.3 kg)  02/13/23 178 lb 9.6 oz (81 kg)     Physical Exam Vitals and nursing note reviewed.  Constitutional:      General: She is not in acute distress.    Appearance: She is well-developed.  Cardiovascular:     Rate and Rhythm: Normal rate and regular rhythm.  Pulmonary:     Effort: Pulmonary effort is normal.     Breath sounds: Normal breath sounds.  Neurological:     Mental Status: She is alert and oriented to person, place, and time.  Assessment & Plan:   Lipid screening -     Lipid panel  Essential hypertension -     CBC -     Comprehensive metabolic panel with GFR  Prediabetes -     CBC -     Comprehensive metabolic panel with GFR -     Hemoglobin A1c     Return in about 6 months (around 11/23/2024).   Bascom GORMAN Borer, NP 05/23/2024

## 2024-05-24 LAB — COMPREHENSIVE METABOLIC PANEL WITH GFR
ALT: 14 IU/L (ref 0–32)
AST: 13 IU/L (ref 0–40)
Albumin: 4.1 g/dL (ref 3.9–4.9)
Alkaline Phosphatase: 125 IU/L — ABNORMAL HIGH (ref 44–121)
BUN/Creatinine Ratio: 21 (ref 9–23)
BUN: 15 mg/dL (ref 6–20)
Bilirubin Total: 0.5 mg/dL (ref 0.0–1.2)
CO2: 23 mmol/L (ref 20–29)
Calcium: 9.6 mg/dL (ref 8.7–10.2)
Chloride: 102 mmol/L (ref 96–106)
Creatinine, Ser: 0.73 mg/dL (ref 0.57–1.00)
Globulin, Total: 3.2 g/dL (ref 1.5–4.5)
Glucose: 177 mg/dL — ABNORMAL HIGH (ref 70–99)
Potassium: 4.4 mmol/L (ref 3.5–5.2)
Sodium: 138 mmol/L (ref 134–144)
Total Protein: 7.3 g/dL (ref 6.0–8.5)
eGFR: 109 mL/min/1.73 (ref >59)

## 2024-05-24 LAB — CBC
Hematocrit: 40.3 % (ref 34.0–46.6)
Hemoglobin: 12.9 g/dL (ref 11.1–15.9)
MCH: 27.6 pg (ref 26.6–33.0)
MCHC: 32 g/dL (ref 31.5–35.7)
MCV: 86 fL (ref 79–97)
Platelets: 271 x10E3/uL (ref 150–450)
RBC: 4.67 x10E6/uL (ref 3.77–5.28)
RDW: 12 % (ref 11.7–15.4)
WBC: 8.1 x10E3/uL (ref 3.4–10.8)

## 2024-05-24 LAB — LIPID PANEL
Chol/HDL Ratio: 2.7 ratio (ref 0.0–4.4)
Cholesterol, Total: 129 mg/dL (ref 100–199)
HDL: 47 mg/dL (ref >39)
LDL Chol Calc (NIH): 70 mg/dL (ref 0–99)
Triglycerides: 56 mg/dL (ref 0–149)
VLDL Cholesterol Cal: 12 mg/dL (ref 5–40)

## 2024-05-24 LAB — HEMOGLOBIN A1C
Est. average glucose Bld gHb Est-mCnc: 151 mg/dL
Hgb A1c MFr Bld: 6.9 % — ABNORMAL HIGH (ref 4.8–5.6)

## 2024-05-26 ENCOUNTER — Ambulatory Visit: Payer: Self-pay | Admitting: Nurse Practitioner

## 2024-06-07 ENCOUNTER — Other Ambulatory Visit: Payer: Self-pay | Admitting: Nurse Practitioner

## 2024-06-10 ENCOUNTER — Other Ambulatory Visit: Payer: Self-pay | Admitting: Nurse Practitioner

## 2024-06-10 DIAGNOSIS — I10 Essential (primary) hypertension: Secondary | ICD-10-CM

## 2024-07-02 ENCOUNTER — Other Ambulatory Visit: Payer: Self-pay | Admitting: Nurse Practitioner

## 2024-07-02 DIAGNOSIS — I10 Essential (primary) hypertension: Secondary | ICD-10-CM

## 2024-08-25 ENCOUNTER — Encounter: Payer: Self-pay | Admitting: Radiology

## 2024-09-20 ENCOUNTER — Other Ambulatory Visit: Payer: Self-pay | Admitting: Nurse Practitioner

## 2024-09-20 DIAGNOSIS — I10 Essential (primary) hypertension: Secondary | ICD-10-CM

## 2024-10-01 ENCOUNTER — Other Ambulatory Visit: Payer: Self-pay | Admitting: Physician Assistant

## 2024-10-01 MED ORDER — HYDROCODONE-ACETAMINOPHEN 5-325 MG PO TABS: 1.0000 | ORAL_TABLET | Freq: Three times a day (TID) | ORAL | 0 refills | Status: AC | PRN

## 2024-10-01 MED ORDER — ONDANSETRON HCL 4 MG PO TABS: 4.0000 mg | ORAL_TABLET | Freq: Three times a day (TID) | ORAL | 0 refills | Status: AC | PRN

## 2024-10-15 ENCOUNTER — Encounter (HOSPITAL_BASED_OUTPATIENT_CLINIC_OR_DEPARTMENT_OTHER): Payer: Self-pay

## 2024-10-15 ENCOUNTER — Ambulatory Visit (HOSPITAL_BASED_OUTPATIENT_CLINIC_OR_DEPARTMENT_OTHER): Admit: 2024-10-15 | Admitting: Orthopaedic Surgery

## 2024-10-15 DIAGNOSIS — M65331 Trigger finger, right middle finger: Secondary | ICD-10-CM

## 2024-10-15 SURGERY — RELEASE, A1 PULLEY, FOR TRIGGER FINGER
Anesthesia: Monitor Anesthesia Care | Laterality: Right

## 2024-10-22 ENCOUNTER — Encounter: Admitting: Physician Assistant

## 2024-11-26 ENCOUNTER — Ambulatory Visit: Payer: Self-pay | Admitting: Nurse Practitioner

## 2024-11-27 ENCOUNTER — Other Ambulatory Visit: Payer: Self-pay | Admitting: Nurse Practitioner

## 2024-11-27 NOTE — Telephone Encounter (Signed)
 Vitamin D , Ergocalciferol , (DRISDOL ) 1.25 MG (50000 UNIT) CAPS capsule [Pharmacy Med Name: VITAMIN D2 1.25MG (50,000 UNIT)]

## 2024-11-27 NOTE — Telephone Encounter (Signed)
 Please Advise.  CB.

## 2024-12-05 ENCOUNTER — Ambulatory Visit: Payer: Self-pay | Admitting: Nurse Practitioner

## 2024-12-11 ENCOUNTER — Ambulatory Visit: Admitting: Nurse Practitioner
